# Patient Record
Sex: Female | Born: 1965
Health system: Southern US, Community
[De-identification: ages and names within clinical notes are randomized; demographics above are authoritative.]

## PROBLEM LIST (undated history)

## (undated) DIAGNOSIS — T7840XA Allergy, unspecified, initial encounter: Secondary | ICD-10-CM

## (undated) DIAGNOSIS — I10 Essential (primary) hypertension: Secondary | ICD-10-CM

## (undated) DIAGNOSIS — M8580 Other specified disorders of bone density and structure, unspecified site: Secondary | ICD-10-CM

## (undated) DIAGNOSIS — E079 Disorder of thyroid, unspecified: Secondary | ICD-10-CM

## (undated) HISTORY — DX: Essential (primary) hypertension: I10

## (undated) HISTORY — DX: Disorder of thyroid, unspecified: E07.9

## (undated) HISTORY — DX: Allergy, unspecified, initial encounter: T78.40XA

## (undated) HISTORY — DX: Other specified disorders of bone density and structure, unspecified site: M85.80

---

## 1993-03-10 HISTORY — PX: TOTAL ABDOMINAL HYSTERECTOMY: SHX209

## 2003-03-11 HISTORY — PX: ELBOW SURGERY: SHX618

## 2007-03-11 HISTORY — PX: LAMINECTOMY: SHX219

## 2015-05-22 LAB — HM HEPATITIS C SCREENING LAB: HM Hepatitis Screen: NEGATIVE

## 2015-05-22 LAB — HM HIV SCREENING LAB: HM HIV Screening: NEGATIVE

## 2015-09-22 DIAGNOSIS — M4807 Spinal stenosis, lumbosacral region: Secondary | ICD-10-CM | POA: Insufficient documentation

## 2015-09-22 DIAGNOSIS — M4325 Fusion of spine, thoracolumbar region: Secondary | ICD-10-CM | POA: Insufficient documentation

## 2018-09-17 LAB — HM MAMMOGRAPHY

## 2019-05-27 ENCOUNTER — Ambulatory Visit (INDEPENDENT_AMBULATORY_CARE_PROVIDER_SITE_OTHER): Payer: Self-pay | Admitting: Family Medicine

## 2019-05-27 ENCOUNTER — Encounter: Payer: Self-pay | Admitting: Family Medicine

## 2019-05-27 ENCOUNTER — Other Ambulatory Visit: Payer: Self-pay

## 2019-05-27 ENCOUNTER — Other Ambulatory Visit: Payer: Self-pay | Admitting: Family Medicine

## 2019-05-27 VITALS — BP 127/76 | HR 89 | Temp 98.2°F | Resp 16 | Ht 63.0 in | Wt 200.6 lb

## 2019-05-27 DIAGNOSIS — Z7689 Persons encountering health services in other specified circumstances: Secondary | ICD-10-CM

## 2019-05-27 DIAGNOSIS — G43109 Migraine with aura, not intractable, without status migrainosus: Secondary | ICD-10-CM

## 2019-05-27 DIAGNOSIS — M4325 Fusion of spine, thoracolumbar region: Secondary | ICD-10-CM

## 2019-05-27 DIAGNOSIS — M4807 Spinal stenosis, lumbosacral region: Secondary | ICD-10-CM

## 2019-05-27 DIAGNOSIS — E039 Hypothyroidism, unspecified: Secondary | ICD-10-CM

## 2019-05-27 DIAGNOSIS — Z0000 Encounter for general adult medical examination without abnormal findings: Secondary | ICD-10-CM

## 2019-05-27 DIAGNOSIS — I10 Essential (primary) hypertension: Secondary | ICD-10-CM | POA: Insufficient documentation

## 2019-05-27 DIAGNOSIS — R7309 Other abnormal glucose: Secondary | ICD-10-CM

## 2019-05-27 DIAGNOSIS — J302 Other seasonal allergic rhinitis: Secondary | ICD-10-CM

## 2019-05-27 MED ORDER — MONTELUKAST SODIUM 10 MG PO TABS: 10.0000 mg | ORAL_TABLET | Freq: Every day | ORAL | 1 refills | Status: DC

## 2019-05-27 MED ORDER — BENAZEPRIL HCL 10 MG PO TABS: 10.0000 mg | ORAL_TABLET | Freq: Every day | ORAL | 1 refills | Status: DC

## 2019-05-27 MED ORDER — DULOXETINE HCL 60 MG PO CPEP: 60.0000 mg | ORAL_CAPSULE | Freq: Every day | ORAL | 1 refills | Status: DC

## 2019-05-27 MED ORDER — LEVOTHYROXINE SODIUM 112 MCG PO TABS: 112.0000 ug | ORAL_TABLET | Freq: Every day | ORAL | 1 refills | Status: DC

## 2019-05-27 MED ORDER — ZOLMITRIPTAN 5 MG PO TABS: 5.0000 mg | ORAL_TABLET | ORAL | 2 refills | Status: DC | PRN

## 2019-05-27 NOTE — Assessment & Plan Note (Signed)
Stable without migraine Chronic problem, recurrent flares unsure triggers No recent migraines in past 5+ years Has abortive therapy w/ Zomig PRN, request re order, for this triptan We can consider alternative option such as Nurtec ODT in future if recurrent migraines or need to change therapy

## 2019-05-27 NOTE — Assessment & Plan Note (Signed)
Chronic back pain Secondary to s/p thoracolumbar spine fusion T12-L1 following traumatic injury thrown off horse Currently still has hardware Chronic pain improved w/ Duloxetine Previously w/ [facility] had ESI in back and neck as needed

## 2019-05-27 NOTE — Assessment & Plan Note (Signed)
Controlled on Singulair and oral anti histamine 2nd gen Continue current meds, refilled singulair

## 2019-05-27 NOTE — Progress Notes (Signed)
Subjective:    Patient ID: Tanya Barry, female    DOB: [birth year], 54 y.o.   MRN: [id number]  Tanya Barry is a 54 y.o. female presenting on 05/27/2019 for Establish Care, Hypertension, and Back Pain  Here to establish care new PCP. Previously working as Technical brewer for [employer]. Now she is locating to new provider here locally since she is working at [employer] for [employer] in [place].  HPI   Migraine Headaches - Chronic history of migraine headaches for most of life. She has done well lately. Unsure common triggers for her. She has had less migraines since moving to [place] back in 2015 approximately. She has rx Zolmitriptan (ZOMIG) but not taking it regularly, has it for PRN use that was effective and now doing well. Request refill for now to have as back up if develop migraine.  Allergic Rhinosinusitis / Seasonal Allergies Chronic issues. She was tested by Allergist and ultimately not confirmed allergic to 50 most common allergens but her symptoms respond to OTC 2nd gen antihistamine cetirizine or loratadine and singulair nightly. - Not taking Flonase currently  Hypothyroidism Chronic issue, has caused weight gain. Says this was onset after 2009. Has been steady on dose levothyroxine 100 to 150 approx, has not had many changes, last lab normal 08/2018 TSH  Chronic Back and Neck Pain / Osteoarthritis / DJD with thoracolumbar spinal stenosis History of traumatic injury 2009 thrown off horse and broke her back, had a laminectomy surgery w hardware T12-L1, ultimately developed issues with hypertension and had chest pain and cardiac symptoms after this injury and HTN, she had a cardiac catheterization and ultimately no blockages, they determined anginal symptoms were triggered by "bruising" on the heart.  Taking Cymbalta 60mg  daily, for about past 10 years for chronic back pain. Helps with her aching pain, takes in PM. Also helps her feet, can get some numbness in  toes if stand too long. - Cervical Lumbar ESI injections from Dr Oleta Mouse, likely last visit in 2018 has done well.  CHRONIC HTN: Reports history of controlled HTN on current med. If misses med can have higher BP 140s Current Meds - Benazepril 10mg  daily   Reports good compliance, took meds today. Tolerating well, w/o complaints. Lifestyle: - Diet: limiting eating out, reduced portions, no fast foods - Exercise: walking with dog regularly, limited gym exercise due to hardware in back. Denies CP, dyspnea, HA, edema, dizziness / lightheadedness   Health Maintenance:  [facility] records - Hep C and HIV screening negative, 05/22/15  History of s/p Total Hysterectomy 1995, no pap smear since 2000  Breast Cancer Screenign: Last mammogram 09/17/18 ([facility]) reviewed record, copied below.  Colon CA Screening: Last Colonoscopy 08/21/15 (Dr Rollene [facility]) negative no polyps reportedly good for 10 years, will request record from:  [address], [place]  [place], [place] [address]  [phone]  [phone] (Fax)   [place]  [place], [place], [place] [address]  [phone]   Depression screen [facility] 2/9 05/27/2019  Decreased Interest 0  Down, Depressed, Hopeless 0  PHQ - 2 Score 0    Past Medical History:  Diagnosis Date  . Allergy   . Hypertension   . Osteopenia   . Thyroid disease    Past Surgical History:  Procedure Laterality Date  . ELBOW SURGERY Right 2005   Cubital Tunnel Nerve Release  . LAMINECTOMY  2009   Laminectomy  T12 fusion, with hardware rods/screws from T12-L1  . TOTAL ABDOMINAL HYSTERECTOMY  1995   Total hysterectomy   Social History   Socioeconomic History  . Marital status: Single    Spouse name: Not on file  . Number of children: Not on file  . Years of education: Not on file  . Highest education level: Not on file  Occupational History  . Not on file  Tobacco Use  . Smoking status: Never  Smoker  . Smokeless tobacco: Never Used  Substance and Sexual Activity  . Alcohol use: Yes  . Drug use: Never  . Sexual activity: Not on file  Other Topics Concern  . Not on file  Social History Narrative  . Not on file   Social Determinants of Health   Financial Resource Strain:   . Difficulty of Paying Living Expenses:   Food Insecurity:   . Worried About Charity fundraiser in the Last Year:   . Arboriculturist in the Last Year:   Transportation Needs:   . Film/video editor (Medical):   Marland Kitchen Lack of Transportation (Non-Medical):   Physical Activity:   . Days of Exercise per Week:   . Minutes of Exercise per Session:   Stress:   . Feeling of Stress :   Social Connections:   . Frequency of Communication with Friends and Family:   . Frequency of Social Gatherings with Friends and Family:   . Attends Religious Services:   . Active Member of Clubs or Organizations:   . Attends Archivist Meetings:   Marland Kitchen Marital Status:   Intimate Partner Violence:   . Fear of Current or Ex-Partner:   . Emotionally Abused:   Marland Kitchen Physically Abused:   . Sexually Abused:    Family History  Problem Relation Age of Onset  . Heart disease Mother   . Stroke Mother   . Thyroid disease Mother   . Alzheimer's disease Mother        living in [place]  . Heart disease Father   . Cancer Father        prostate   . Aneurysm Father        brain   Current Outpatient Medications on File Prior to Visit  Medication Sig  . Ascorbic Acid (VITA-C PO) Take 500 mg by mouth.  . fluticasone (FLONASE) 50 MCG/ACT nasal spray Place into the nose.  . loratadine (CLARITIN) 10 MG tablet TAKE 1 TABLET DAILY  . Multiple Vitamin (MULTIVITAMIN) tablet Take 1 tablet by mouth daily.  . Probiotic Product (PROBIOTIC-10 PO) Take by mouth.   No current facility-administered medications on file prior to visit.    Review of Systems Per HPI unless specifically indicated above      Objective:    BP  127/76   Pulse 89   Temp 98.2 F (36.8 C) (Temporal)   Resp 16   Ht 5\' 3"  (1.6 m)   Wt 200 lb 9.6 oz (91 kg)   SpO2 99%   BMI 35.53 kg/m   Wt Readings from Last 3 Encounters:  05/27/19 200 lb 9.6 oz (91 kg)    Physical Exam Vitals and nursing note reviewed.  Constitutional:      General: She is not in acute distress.    Appearance: She is well-developed. She is not diaphoretic.     Comments: Well-appearing, comfortable, cooperative  HENT:     Head: Normocephalic and atraumatic.  Eyes:     General:  Right eye: No discharge.        Left eye: No discharge.     Conjunctiva/sclera: Conjunctivae normal.  Cardiovascular:     Rate and Rhythm: Normal rate.  Pulmonary:     Effort: Pulmonary effort is normal.  Skin:    General: Skin is warm and dry.     Findings: No erythema or rash.  Neurological:     Mental Status: She is alert and oriented to person, place, and time.  Psychiatric:        Behavior: Behavior normal.     Comments: Well groomed, good eye contact, normal speech and thoughts      Mammo screening breast tomosynthesis bilateral7/12/2018 [facility] Result Impression  No mammographic evidence of malignancy. Recommend routine mammography  screening in one year.    The patient has been or will be contacted.  BI-RADS: 2 - Benign  Result Narrative  EXAM: MAMMO SCREEN BREAST WITH TOMOSYNTHESIS BILATERAL 09/16/2018 3:30 PM  INDICATION: Screening  COMPARISON: Compared to: 08/05/2017 Mammo screening breast tomosynthesis bilateral and  05/31/2015 Mammo screening digital bilateral   TECHNIQUE: Tomosynthesis images were obtained as part of this exam.  FINDINGS: The breasts have scattered areas of fibroglandular density.  There are no suspicious masses, calcifications, or other findings in  either breast.     Results for orders placed or performed in visit on 05/27/19  HM MAMMOGRAPHY  Result Value Ref Range   HM Mammogram 0-4  Bi-Rad 0-4 Bi-Rad, Self Reported Normal  HM HIV SCREENING LAB  Result Value Ref Range   HM HIV Screening Negative - Validated   HM HEPATITIS C SCREENING LAB  Result Value Ref Range   HM Hepatitis Screen Negative-Validated       Assessment & Plan:   Problem List Items Addressed This Visit    Stenosis of lumbosacral spine   Relevant Medications   DULoxetine (CYMBALTA) 60 MG capsule   Seasonal allergies    Controlled on Singulair and oral anti histamine 2nd gen Continue current meds, refilled singulair      Relevant Medications   montelukast (SINGULAIR) 10 MG tablet   Migraine with aura and without status migrainosus, not intractable    Stable without migraine Chronic problem, recurrent flares unsure triggers No recent migraines in past 5+ years Has abortive therapy w/ Zomig PRN, request re order, for this triptan We can consider alternative option such as Nurtec ODT in future if recurrent migraines or need to change therapy      Relevant Medications   zolmitriptan (ZOMIG) 5 MG tablet   DULoxetine (CYMBALTA) 60 MG capsule   benazepril (LOTENSIN) 10 MG tablet   Fusion of spine of thoracolumbar region    Chronic back pain Secondary to s/p thoracolumbar spine fusion T12-L1 following traumatic injury thrown off horse Currently still has hardware Chronic pain improved w/ Duloxetine Previously w/ [facility] had ESI in back and neck as needed      Relevant Medications   DULoxetine (CYMBALTA) 60 MG capsule   Essential hypertension    Well-controlled HTN, possibly related to history of back pain in past when onset on med  No known complications     Plan:  1. Continue current BP regimen - refilled Benazepril 10mg  daily 2. Encourage improved lifestyle - low sodium diet, regular exercise 3. May monitor BP outside office  Labs / follow up in 4 weeks      Relevant Medications   benazepril (LOTENSIN) 10 MG tablet   Acquired hypothyroidism -  Primary    Controlled  hypothyroidism On levothyroxine currently 174mcg daily Last lab TSH normal 08/2018 Due for repeat thyroid panel in 4 weeks w/ annual      Relevant Medications   levothyroxine (SYNTHROID) 112 MCG tablet    Other Visit Diagnoses    Encounter to establish care with new doctor          Review outside [facility] specialists.  Meds ordered this encounter  Medications  . zolmitriptan (ZOMIG) 5 MG tablet    Sig: Take 1 tablet (5 mg total) by mouth as needed for migraine.    Dispense:  10 tablet    Refill:  2  . montelukast (SINGULAIR) 10 MG tablet    Sig: Take 1 tablet (10 mg total) by mouth at bedtime.    Dispense:  90 tablet    Refill:  1  . levothyroxine (SYNTHROID) 112 MCG tablet    Sig: Take 1 tablet (112 mcg total) by mouth daily before breakfast.    Dispense:  90 tablet    Refill:  1  . DULoxetine (CYMBALTA) 60 MG capsule    Sig: Take 1 capsule (60 mg total) by mouth daily.    Dispense:  90 capsule    Refill:  1  . benazepril (LOTENSIN) 10 MG tablet    Sig: Take 1 tablet (10 mg total) by mouth daily.    Dispense:  90 tablet    Refill:  1     Follow up plan: Return in about 4 weeks (around 06/24/2019) for Annual Physical.   Future labs ordered for 06/17/19. CMET CBC Lipid A1c TSH Free T4  Nobie Putnam, DO [facility] 05/27/2019, 3:16 PM

## 2019-05-27 NOTE — Assessment & Plan Note (Signed)
Well-controlled HTN, possibly related to history of back pain in past when onset on med  No known complications     Plan:  1. Continue current BP regimen - refilled Benazepril 10mg  daily 2. Encourage improved lifestyle - low sodium diet, regular exercise 3. May monitor BP outside office  Labs / follow up in 4 weeks

## 2019-05-27 NOTE — Patient Instructions (Addendum)
Thank you for coming to the office today.  Refilled Zomig (Zolmitriptan)- if this is not preferred anymore or cost is higher we can consider alternative option - such as Nurtec dissolving tablet, it is an excellent medicine that is newer and has limited side effects and can be effective for up to 48 hours after 1 dose.  Meds were re ordered 90 day with refill  Next apt we will order Mammogram  Call the [facility] below anytime to schedule your own appointment now that order has been placed.  [facility] [place], [place] [address] Phone: [phone]   DUE for FASTING BLOOD WORK (no food or drink after midnight before the lab appointment, only water or coffee without cream/sugar on the morning of)  SCHEDULE "Lab Only" visit in the morning at the clinic for lab draw in 4 WEEKS   - Make sure Lab Only appointment is at about 1 week before your next appointment, so that results will be available  For Lab Results, once available within 2-3 days of blood draw, you can can log in to MyChart online to view your results and a brief explanation. Also, we can discuss results at next follow-up visit.    Please schedule a Follow-up Appointment to: Return in about 4 weeks (around 06/24/2019) for Annual Physical.  If you have any other questions or concerns, please feel free to call the office or send a message through [place]. You may also schedule an earlier appointment if necessary.  Additionally, you may be receiving a survey about your experience at our office within a few days to 1 week by e-mail or mail. We value your feedback.  Nobie Putnam, DO [facility]

## 2019-05-27 NOTE — Assessment & Plan Note (Signed)
Controlled hypothyroidism On levothyroxine currently 124mcg daily Last lab TSH normal 08/2018 Due for repeat thyroid panel in 4 weeks w/ annual

## 2019-05-31 ENCOUNTER — Telehealth: Payer: Self-pay | Admitting: Family Medicine

## 2019-05-31 DIAGNOSIS — G43109 Migraine with aura, not intractable, without status migrainosus: Secondary | ICD-10-CM

## 2019-05-31 MED ORDER — SUMATRIPTAN SUCCINATE 50 MG PO TABS: 50.0000 mg | ORAL_TABLET | Freq: Once | ORAL | 2 refills | Status: DC | PRN

## 2019-05-31 NOTE — Telephone Encounter (Signed)
Fax that Zomig requires PA. Preferred sumatriptan or rizatriptan trial in past 180 days.  Called patient, she has never taken those meds.  DC Zomig  Ordered Sumatriptan 50mg      Notes to Pharmacy: Discontinued Zomig Zolmitriptan - due to insurance preference. Switched to Sumatriptan    Nobie Putnam, [facility] 05/31/2019, 5:17 PM

## 2019-06-17 ENCOUNTER — Other Ambulatory Visit: Payer: Self-pay

## 2019-06-17 DIAGNOSIS — I10 Essential (primary) hypertension: Secondary | ICD-10-CM

## 2019-06-17 DIAGNOSIS — E039 Hypothyroidism, unspecified: Secondary | ICD-10-CM

## 2019-06-17 DIAGNOSIS — Z0000 Encounter for general adult medical examination without abnormal findings: Secondary | ICD-10-CM

## 2019-06-17 DIAGNOSIS — R7309 Other abnormal glucose: Secondary | ICD-10-CM

## 2019-06-18 LAB — LIPID PANEL
Cholesterol: 177 mg/dL (ref <200)
HDL: 51 mg/dL (ref >=50)
LDL Cholesterol (Calc): 107 mg/dL (calc) — ABNORMAL HIGH
Non-HDL Cholesterol (Calc): 126 mg/dL (calc) (ref <130)
Total CHOL/HDL Ratio: 3.5 (calc) (ref <5.0)
Triglycerides: 95 mg/dL (ref <150)

## 2019-06-18 LAB — COMPLETE METABOLIC PANEL WITH GFR
AG Ratio: 1.4 (calc) (ref 1.0–2.5)
ALT: 18 U/L (ref 6–29)
AST: 21 U/L (ref 10–35)
Albumin: 3.8 g/dL (ref 3.6–5.1)
Alkaline phosphatase (APISO): 79 U/L (ref 37–153)
BUN: 20 mg/dL (ref 7–25)
CO2: 28 mmol/L (ref 20–32)
Calcium: 9.1 mg/dL (ref 8.6–10.4)
Chloride: 102 mmol/L (ref 98–110)
Creat: 0.61 mg/dL (ref 0.50–1.05)
GFR, Est African American: 117 mL/min/{1.73_m2} (ref >=60)
GFR, Est Non African American: 101 mL/min/{1.73_m2} (ref >=60)
Globulin: 2.7 g/dL (calc) (ref 1.9–3.7)
Glucose, Bld: 82 mg/dL (ref 65–99)
Potassium: 4.5 mmol/L (ref 3.5–5.3)
Sodium: 136 mmol/L (ref 135–146)
Total Bilirubin: 0.3 mg/dL (ref 0.2–1.2)
Total Protein: 6.5 g/dL (ref 6.1–8.1)

## 2019-06-18 LAB — HEMOGLOBIN A1C
Hgb A1c MFr Bld: 5.1 % of total Hgb (ref <5.7)
Mean Plasma Glucose: 100 (calc)
eAG (mmol/L): 5.5 (calc)

## 2019-06-18 LAB — CBC WITH DIFFERENTIAL/PLATELET
Absolute Monocytes: 584 cells/uL (ref 200–950)
Basophils Absolute: 59 cells/uL (ref 0–200)
Basophils Relative: 1 %
Eosinophils Absolute: 212 cells/uL (ref 15–500)
Eosinophils Relative: 3.6 %
HCT: 38.1 % (ref 35.0–45.0)
Hemoglobin: 12.1 g/dL (ref 11.7–15.5)
Lymphs Abs: 2395 cells/uL (ref 850–3900)
MCH: 28.8 pg (ref 27.0–33.0)
MCHC: 31.8 g/dL — ABNORMAL LOW (ref 32.0–36.0)
MCV: 90.7 fL (ref 80.0–100.0)
MPV: 10.3 fL (ref 7.5–12.5)
Monocytes Relative: 9.9 %
Neutro Abs: 2649 cells/uL (ref 1500–7800)
Neutrophils Relative %: 44.9 %
Platelets: 349 10*3/uL (ref 140–400)
RBC: 4.2 10*6/uL (ref 3.80–5.10)
RDW: 12 % (ref 11.0–15.0)
Total Lymphocyte: 40.6 %
WBC: 5.9 10*3/uL (ref 3.8–10.8)

## 2019-06-18 LAB — TSH: TSH: 0.03 mIU/L — ABNORMAL LOW (ref 0.40–4.50)

## 2019-06-18 LAB — T4, FREE: Free T4: 1.5 ng/dL (ref 0.8–1.8)

## 2019-06-24 ENCOUNTER — Other Ambulatory Visit: Payer: Self-pay

## 2019-06-24 ENCOUNTER — Other Ambulatory Visit: Payer: Self-pay | Admitting: Family Medicine

## 2019-06-24 ENCOUNTER — Encounter: Payer: Self-pay | Admitting: Family Medicine

## 2019-06-24 ENCOUNTER — Ambulatory Visit (INDEPENDENT_AMBULATORY_CARE_PROVIDER_SITE_OTHER): Payer: 59 | Admitting: Family Medicine

## 2019-06-24 VITALS — BP 118/75 | HR 76 | Temp 97.5°F | Resp 16 | Ht 63.0 in | Wt 202.6 lb

## 2019-06-24 DIAGNOSIS — Z0000 Encounter for general adult medical examination without abnormal findings: Secondary | ICD-10-CM | POA: Diagnosis not present

## 2019-06-24 DIAGNOSIS — E039 Hypothyroidism, unspecified: Secondary | ICD-10-CM

## 2019-06-24 DIAGNOSIS — I10 Essential (primary) hypertension: Secondary | ICD-10-CM

## 2019-06-24 NOTE — Assessment & Plan Note (Signed)
Controlled hypothyroidism, but last lab was mild low TSH otherwise normal Free T4 Asymptomatic On levothyroxine currently 159mcg daily  Continue current dose Repeat in 6 months

## 2019-06-24 NOTE — Patient Instructions (Addendum)
Thank you for coming to the office today.  Ask again if they can send request for record Last Colonoscopy 08/21/15 (Dr Rollene [facility]unda [facility])  1. Chemistry - Normal results, including electrolytes, kidney and liver function. - Normal fasting blood sugar   2. Hemoglobin A1c (Diabetes screening) - 5.1, normal not in range of Pre-Diabetes (>5.7 to 6.4)   3. TSH Thyroid Function Tests - Low TSH meaning may have too high of dose Levothyroxine at 146mcg daily, however Free T4 is in normal range. - No change to dose today.  4. CBC Blood Counts - Normal, no anemia, no other significant abnormality  5. Cholesterol - Mostly normal cholesterol results. Normal HDL (good cholesterol), slightly elevated 107 LDL (bad cholesterol), and normal Triglycerides.  - No need for cholesterol medicine at this time.  ---------------------------------------------  DUE for FASTING BLOOD WORK (no food or drink after midnight before the lab appointment, only water or coffee without cream/sugar on the morning of)  SCHEDULE "Lab Only" visit in the morning at the clinic for lab draw in 6 MONTHS   - Make sure Lab Only appointment is at about 1 week before your next appointment, so that results will be available  For Lab Results, once available within 2-3 days of blood draw, you can can log in to MyChart online to view your results and a brief explanation. Also, we can discuss results at next follow-up visit.   Please schedule a Follow-up Appointment to: Return in about 6 months (around 12/24/2019) for 6 month Thyroid labs.  If you have any other questions or concerns, please feel free to call the office or send a message through [place]. You may also schedule an earlier appointment if necessary.  Additionally, you may be receiving a survey about your experience at our office within a few days to 1 week by e-mail or mail. We value your feedback.  Nobie Putnam, DO [facility]

## 2019-06-24 NOTE — Assessment & Plan Note (Signed)
Well-controlled HTN, possibly related to history of back pain in past when onset on med  No known complications     Plan:  1. Continue current BP regimen - refilled Benazepril 10mg  daily 2. Encourage improved lifestyle - low sodium diet, regular exercise 3. May monitor BP outside office

## 2019-06-24 NOTE — Progress Notes (Signed)
Subjective:    Patient ID: Tanya Barry, female    DOB: 07/28/1965, 54 y.o.   MRN: [id number]  Tanya Barry is a 54 y.o. female presenting on 06/24/2019 for Annual Exam   HPI   Here for Annual Physical and Lab Review.  Migraine Headaches Chronic history of migraine headaches for most of life. She has done well lately. Last visit reordered medicine.  Allergic Rhinosinusitis / Seasonal Allergies  Hypothyroidism Chronic issue, has caused weight gain. Says this was onset after 2009. Has been steady on dose levothyroxine 100 to 150 approx, has not had many changes Last labs were 06/2019 showed mild low TSH 0.03, and normal free t4 1.5 She is on Levothyroxine 133mcg daily, has no complaints today  Chronic Back and Neck Pain / Osteoarthritis / DJD with thoracolumbar spinal stenosis History of traumatic injury 2009 thrown off horse and broke her back, had a laminectomy surgery w hardware T12-L1, ultimately developed issues with hypertension and had chest pain and cardiac symptoms after this injury and HTN, she had a cardiac catheterization and ultimately no blockages, they determined anginal symptoms were triggered by "bruising" on the heart.  Taking Cymbalta 60mg  daily, for about past 10 years for chronic back pain. Helps with her aching pain, takes in PM. Also helps her feet, can get some numbness in toes if stand too long. - Cervical Lumbar ESI injections from Dr Oleta Mouse, likely last visit in 2018 has done well.  CHRONIC HTN: Reports history of controlled HTN on current med. If misses med can have higher BP 140s Doing well normal BP today Current Meds - Benazepril 10mg  daily   Reports good compliance, took meds today. Tolerating well, w/o complaints. Lifestyle: - Diet: limiting eating out, reduced portions, no fast foods - Exercise: walking with dog regularly, limited gym exercise due to hardware in back. Denies CP, dyspnea, HA, edema, dizziness / lightheadedness   Health  Maintenance:  [facility] records - Hep C and HIV screening negative, 05/22/15  History of s/p Total Hysterectomy 1995, no pap smear since 2000  Breast Cancer Screenign: Last mammogram 09/17/18 ([facility]) reviewed record, copied below.  Colon CA Screening: Last Colonoscopy 08/21/15 (Dr [facility]) negative no polyps reportedly good for 10 years, will request record - still did not receive since last time.   Depression screen [facility] 2/9 06/24/2019 05/27/2019  Decreased Interest 0 0  Down, Depressed, Hopeless 0 0  PHQ - 2 Score 0 0    Past Medical History:  Diagnosis Date  . Allergy   . Hypertension   . Osteopenia   . Thyroid disease    Past Surgical History:  Procedure Laterality Date  . ELBOW SURGERY Right 2005   Cubital Tunnel Nerve Release  . LAMINECTOMY  2009   Laminectomy T12 fusion, with hardware rods/screws from T12-L1  . TOTAL ABDOMINAL HYSTERECTOMY  1995   Total hysterectomy   Social History   Socioeconomic History  . Marital status: Single    Spouse name: Not on file  . Number of children: Not on file  . Years of education: Not on file  . Highest education level: Not on file  Occupational History  . Not on file  Tobacco Use  . Smoking status: Never Smoker  . Smokeless tobacco: Never Used  Substance and Sexual Activity  . Alcohol use: Yes  . Drug use: Never  . Sexual activity: Not on file  Other Topics Concern  . Not on file  Social History Narrative  .  Not on file   Social Determinants of Health   Financial Resource Strain:   . Difficulty of Paying Living Expenses:   Food Insecurity:   . Worried About Charity fundraiser in the Last Year:   . Arboriculturist in the Last Year:   Transportation Needs:   . Film/video editor (Medical):   Marland Kitchen Lack of Transportation (Non-Medical):   Physical Activity:   . Days of Exercise per Week:   . Minutes of Exercise per Session:   Stress:   . Feeling of Stress :   Social Connections:   . Frequency of  Communication with Friends and Family:   . Frequency of Social Gatherings with Friends and Family:   . Attends Religious Services:   . Active Member of Clubs or Organizations:   . Attends Archivist Meetings:   Marland Kitchen Marital Status:   Intimate Partner Violence:   . Fear of Current or Ex-Partner:   . Emotionally Abused:   Marland Kitchen Physically Abused:   . Sexually Abused:    Family History  Problem Relation Age of Onset  . Heart disease Mother   . Stroke Mother   . Thyroid disease Mother   . Alzheimer's disease Mother        living in [place]  . Heart disease Father   . Cancer Father        prostate   . Aneurysm Father        brain   Current Outpatient Medications on File Prior to Visit  Medication Sig  . amoxicillin (AMOXIL) 500 MG tablet Take 500 mg by mouth 4 (four) times daily.  . Ascorbic Acid (VITA-C PO) Take 500 mg by mouth.  . benazepril (LOTENSIN) 10 MG tablet Take 1 tablet (10 mg total) by mouth daily.  . DULoxetine (CYMBALTA) 60 MG capsule Take 1 capsule (60 mg total) by mouth daily.  . fluticasone (FLONASE) 50 MCG/ACT nasal spray Place into the nose.  . levothyroxine (SYNTHROID) 112 MCG tablet Take 1 tablet (112 mcg total) by mouth daily before breakfast.  . loratadine (CLARITIN) 10 MG tablet TAKE 1 TABLET DAILY  . montelukast (SINGULAIR) 10 MG tablet Take 1 tablet (10 mg total) by mouth at bedtime.  . Multiple Vitamin (MULTIVITAMIN) tablet Take 1 tablet by mouth daily.  . Probiotic Product (PROBIOTIC-10 PO) Take by mouth.  . SUMAtriptan (IMITREX) 50 MG tablet Take 1 tablet (50 mg total) by mouth once as needed for up to 1 dose for migraine. May repeat one dose in 2 hours if headache persists, for max dose 24 hours   No current facility-administered medications on file prior to visit.    Review of Systems  Constitutional: Negative for activity change, appetite change, chills, diaphoresis, fatigue and fever.  HENT: Negative for congestion and hearing loss.     Eyes: Negative for visual disturbance.  Respiratory: Negative for cough, chest tightness, shortness of breath and wheezing.   Cardiovascular: Negative for chest pain, palpitations and leg swelling.  Gastrointestinal: Negative for abdominal pain, constipation, diarrhea, nausea and vomiting.  Genitourinary: Negative for dysuria, frequency and hematuria.  Musculoskeletal: Negative for arthralgias and neck pain.  Skin: Negative for rash.  Neurological: Negative for dizziness, weakness, light-headedness, numbness and headaches.  Hematological: Negative for adenopathy.  Psychiatric/Behavioral: Negative for behavioral problems, dysphoric mood and sleep disturbance.   Per HPI unless specifically indicated above      Objective:    BP 118/75   Pulse 76   Temp (!)  97.5 F (36.4 C) (Temporal)   Resp 16   Ht 5\' 3"  (1.6 m)   Wt 202 lb 9.6 oz (91.9 kg)   BMI 35.89 kg/m   Wt Readings from Last 3 Encounters:  06/24/19 202 lb 9.6 oz (91.9 kg)  05/27/19 200 lb 9.6 oz (91 kg)    Physical Exam Vitals and nursing note reviewed.  Constitutional:      General: She is not in acute distress.    Appearance: She is well-developed. She is not diaphoretic.     Comments: Well-appearing, comfortable, cooperative  HENT:     Head: Normocephalic and atraumatic.  Eyes:     General:        Right eye: No discharge.        Left eye: No discharge.     Conjunctiva/sclera: Conjunctivae normal.     Pupils: Pupils are equal, round, and reactive to light.  Neck:     Thyroid: No thyromegaly.     Comments: No thyromegaly Cardiovascular:     Rate and Rhythm: Normal rate and regular rhythm.     Heart sounds: Normal heart sounds. No murmur.  Pulmonary:     Effort: Pulmonary effort is normal. No respiratory distress.     Breath sounds: Normal breath sounds. No wheezing or rales.  Abdominal:     General: Bowel sounds are normal. There is no distension.     Palpations: Abdomen is soft. There is no mass.      Tenderness: There is no abdominal tenderness.  Musculoskeletal:        General: No tenderness. Normal range of motion.     Cervical back: Normal range of motion and neck supple.     Comments: Upper / Lower Extremities: - Normal muscle tone, strength bilateral upper extremities 5/5, lower extremities 5/5  Lymphadenopathy:     Cervical: No cervical adenopathy.  Skin:    General: Skin is warm and dry.     Findings: No erythema or rash.  Neurological:     Mental Status: She is alert and oriented to person, place, and time.     Comments: Distal sensation intact to light touch all extremities  Psychiatric:        Behavior: Behavior normal.     Comments: Well groomed, good eye contact, normal speech and thoughts    Results for orders placed or performed in visit on 06/17/19  T4, free  Result Value Ref Range   Free T4 1.5 0.8 - 1.8 ng/dL  TSH  Result Value Ref Range   TSH 0.03 (L) 0.40 - 4.50 mIU/L  Lipid panel  Result Value Ref Range   Cholesterol 177 <200 mg/dL   HDL 51 > OR = 50 mg/dL   Triglycerides 95 <150 mg/dL   LDL Cholesterol (Calc) 107 (H) mg/dL (calc)   Total CHOL/HDL Ratio 3.5 <5.0 (calc)   Non-HDL Cholesterol (Calc) 126 <130 mg/dL (calc)  COMPLETE METABOLIC PANEL WITH GFR  Result Value Ref Range   Glucose, Bld 82 65 - 99 mg/dL   BUN 20 7 - 25 mg/dL   Creat 0.61 0.50 - 1.05 mg/dL   GFR, Est Non African American 101 > OR = 60 mL/min/1.11m2   GFR, Est African American 117 > OR = 60 mL/min/1.60m2   BUN/Creatinine Ratio NOT APPLICABLE 6 - 22 (calc)   Sodium 136 135 - 146 mmol/L   Potassium 4.5 3.5 - 5.3 mmol/L   Chloride 102 98 - 110 mmol/L   CO2 28 20 -  32 mmol/L   Calcium 9.1 8.6 - 10.4 mg/dL   Total Protein 6.5 6.1 - 8.1 g/dL   Albumin 3.8 3.6 - 5.1 g/dL   Globulin 2.7 1.9 - 3.7 g/dL (calc)   AG Ratio 1.4 1.0 - 2.5 (calc)   Total Bilirubin 0.3 0.2 - 1.2 mg/dL   Alkaline phosphatase (APISO) 79 37 - 153 U/L   AST 21 10 - 35 U/L   ALT 18 6 - 29 U/L  CBC with  Differential/Platelet  Result Value Ref Range   WBC 5.9 3.8 - 10.8 Thousand/uL   RBC 4.20 3.80 - 5.10 Million/uL   Hemoglobin 12.1 11.7 - 15.5 g/dL   HCT 38.1 35.0 - 45.0 %   MCV 90.7 80.0 - 100.0 fL   MCH 28.8 27.0 - 33.0 pg   MCHC 31.8 (L) 32.0 - 36.0 g/dL   RDW 12.0 11.0 - 15.0 %   Platelets 349 140 - 400 Thousand/uL   MPV 10.3 7.5 - 12.5 fL   Neutro Abs 2,649 1,500 - 7,800 cells/uL   Lymphs Abs 2,395 850 - 3,900 cells/uL   Absolute Monocytes 584 200 - 950 cells/uL   Eosinophils Absolute 212 15 - 500 cells/uL   Basophils Absolute 59 0 - 200 cells/uL   Neutrophils Relative % 44.9 %   Total Lymphocyte 40.6 %   Monocytes Relative 9.9 %   Eosinophils Relative 3.6 %   Basophils Relative 1.0 %  Hemoglobin A1c  Result Value Ref Range   Hgb A1c MFr Bld 5.1 <5.7 % of total Hgb   Mean Plasma Glucose 100 (calc)   eAG (mmol/L) 5.5 (calc)      Assessment & Plan:   Problem List Items Addressed This Visit    Essential hypertension    Well-controlled HTN, possibly related to history of back pain in past when onset on med  No known complications     Plan:  1. Continue current BP regimen - refilled Benazepril 10mg  daily 2. Encourage improved lifestyle - low sodium diet, regular exercise 3. May monitor BP outside office      Acquired hypothyroidism    Controlled hypothyroidism, but last lab was mild low TSH otherwise normal Free T4 Asymptomatic On levothyroxine currently 164mcg daily  Continue current dose Repeat in 6 months       Other Visit Diagnoses    Annual physical exam    -  Primary      Updated Health Maintenance information - Request record from 2017 colonoscopy [facility], otherwise up to date Reviewed recent lab results with patient Encouraged improvement to lifestyle with diet and exercise - Goal of weight loss   No orders of the defined types were placed in this encounter.     Follow up plan: Return in about 6 months (around 12/24/2019) for 6 month  Thyroid labs.   Future labs TSH Free T4 6 months  Nobie Putnam, DO [facility] 06/24/2019, 2:12 PM

## 2019-08-10 DIAGNOSIS — J3089 Other allergic rhinitis: Secondary | ICD-10-CM

## 2019-08-10 DIAGNOSIS — J302 Other seasonal allergic rhinitis: Secondary | ICD-10-CM

## 2019-08-13 ENCOUNTER — Ambulatory Visit (INDEPENDENT_AMBULATORY_CARE_PROVIDER_SITE_OTHER): Payer: 59

## 2019-08-13 ENCOUNTER — Other Ambulatory Visit: Payer: Self-pay

## 2019-08-13 ENCOUNTER — Ambulatory Visit
Admission: EM | Admit: 2019-08-13 | Discharge: 2019-08-13 | Disposition: A | Discharge status: Home / Self Care | Payer: 59 | Attending: Family Medicine | Admitting: Family Medicine

## 2019-08-13 DIAGNOSIS — S92355A Nondisplaced fracture of fifth metatarsal bone, left foot, initial encounter for closed fracture: Secondary | ICD-10-CM

## 2019-08-13 DIAGNOSIS — S92352A Displaced fracture of fifth metatarsal bone, left foot, initial encounter for closed fracture: Secondary | ICD-10-CM | POA: Diagnosis not present

## 2019-08-13 NOTE — ED Triage Notes (Addendum)
Patient states that today she stepped in a hole wrong and turned over her left foot. Patient with swelling and bruising to outside of foot.

## 2019-08-13 NOTE — Discharge Instructions (Addendum)
Ice.  Elevate.  Keep in splint.  Use crutches.  Follow-up with podiatry or orthopedic this week as discussed.  Follow up with your primary care physician this week as needed. Return to Urgent care for new or worsening concerns.

## 2019-08-13 NOTE — ED Provider Notes (Signed)
[facility] ____________________________________________  Time seen: Approximately 2:26 PM  I have reviewed the triage vital signs and the nursing notes.   HISTORY  Chief Complaint Foot Injury (left)   HPI Tanya Barry is a 54 y.o. female presenting for evaluation of left lateral foot pain after injury that occurred this morning.  Reports she excellently stepped into hole wrong and inverted her left foot.  Reports pain to left foot since with associated bruising and swelling.  Has been able to weight-bear but has to walk on the inside of her foot.  Denies pain radiation, paresthesias or other discomfort.  Reports otherwise doing well.  Denies recent sickness.   Past Medical History:  Diagnosis Date  . Allergy   . Hypertension   . Osteopenia   . Thyroid disease     Patient Active Problem List   Diagnosis Date Noted  . Acquired hypothyroidism 05/27/2019  . Essential hypertension 05/27/2019  . Seasonal allergies 05/27/2019  . Migraine with aura and without status migrainosus, not intractable 05/27/2019  . Fusion of spine of thoracolumbar region 09/22/2015  . Stenosis of lumbosacral spine 09/22/2015    Past Surgical History:  Procedure Laterality Date  . ELBOW SURGERY Right 2005   Cubital Tunnel Nerve Release  . LAMINECTOMY  2009   Laminectomy T12 fusion, with hardware rods/screws from T12-L1  . TOTAL ABDOMINAL HYSTERECTOMY  1995   Total hysterectomy     No current facility-administered medications for this encounter.  Current Outpatient Medications:  .  Ascorbic Acid (VITA-C PO), Take 500 mg by mouth., Disp: , Rfl:  .  benazepril (LOTENSIN) 10 MG tablet, Take 1 tablet (10 mg total) by mouth daily., Disp: 90 tablet, Rfl: 1 .  DULoxetine (CYMBALTA) 60 MG capsule, Take 1 capsule (60 mg total) by mouth daily., Disp: 90 capsule, Rfl: 1 .  fluticasone (FLONASE) 50 MCG/ACT nasal spray, Place into the nose., Disp: , Rfl:  .  levothyroxine (SYNTHROID) 112  MCG tablet, Take 1 tablet (112 mcg total) by mouth daily before breakfast., Disp: 90 tablet, Rfl: 1 .  loratadine (CLARITIN) 10 MG tablet, TAKE 1 TABLET DAILY, Disp: , Rfl:  .  montelukast (SINGULAIR) 10 MG tablet, Take 1 tablet (10 mg total) by mouth at bedtime., Disp: 90 tablet, Rfl: 1 .  Multiple Vitamin (MULTIVITAMIN) tablet, Take 1 tablet by mouth daily., Disp: , Rfl:  .  Probiotic Product (PROBIOTIC-10 PO), Take by mouth., Disp: , Rfl:  .  SUMAtriptan (IMITREX) 50 MG tablet, Take 1 tablet (50 mg total) by mouth once as needed for up to 1 dose for migraine. May repeat one dose in 2 hours if headache persists, for max dose 24 hours, Disp: 10 tablet, Rfl: 2 .  amoxicillin (AMOXIL) 500 MG tablet, Take 500 mg by mouth 4 (four) times daily., Disp: , Rfl:   Allergies Gabapentin and Aspirin  Family History  Problem Relation Age of Onset  . Heart disease Mother   . Stroke Mother   . Thyroid disease Mother   . Alzheimer's disease Mother        living in [place]  . Heart disease Father   . Cancer Father        prostate   . Aneurysm Father        brain    Social History Social History   Tobacco Use  . Smoking status: Never Smoker  . Smokeless tobacco: Never Used  Substance Use Topics  . Alcohol use: Yes  . Drug use: Never  Review of Systems Constitutional: No fever Cardiovascular: Denies chest pain. Respiratory: Denies shortness of breath. Musculoskeletal: Positive left foot pain. Skin: Negative for rash. Neurological: Negative for numbness.    ____________________________________________   PHYSICAL EXAM:  VITAL SIGNS: ED Triage Vitals  Enc Vitals Group     BP 08/13/19 1300 (!) 132/94     Pulse Rate 08/13/19 1300 68     Resp 08/13/19 1300 18     Temp 08/13/19 1300 98.3 F (36.8 C)     Temp Source 08/13/19 1300 Oral     SpO2 08/13/19 1300 100 %     Weight 08/13/19 1258 198 lb (89.8 kg)     Height 08/13/19 1258 5\' 4"  (1.626 m)     Head Circumference --       Peak Flow --      Pain Score 08/13/19 1258 8     Pain Loc --      Pain Edu? --      Excl. in [place]? --     Constitutional: Alert and oriented. Well appearing and in no acute distress. Eyes: Conjunctivae are normal.  ENT      Head: Normocephalic and atraumatic. Cardiovascular: Good peripheral circulation. Respiratory: Normal respiratory effort without tachypnea nor retractions.  Musculoskeletal: Gait not tested due to pain.  Distal pedal pulses intact. Except: Left lateral foot at distal to mid 5th metatarsal tenderness to direct palpation with swelling and ecchymosis, left foot otherwise nontender, normal distal sensation cap refill. Neurologic:  Normal speech and language.  Skin:  Skin is warm, dry and intact. No rash noted. Psychiatric: Mood and affect are normal. Speech and behavior are normal. Patient exhibits appropriate insight and judgment   ___________________________________________   LABS (all labs ordered are listed, but only abnormal results are displayed)  Labs Reviewed - No data to display  RADIOLOGY  DG Foot Complete Left  Result Date: 08/13/2019 CLINICAL DATA:  Foot injury stepping in a hole. Swelling and bruising laterally. EXAM: LEFT FOOT - COMPLETE 3+ VIEW COMPARISON:  None. FINDINGS: Oblique fracture of the distal shaft and distal metaphysis of the fifth metatarsal with surrounding soft tissue swelling. Mild spurring of the first metatarsal head. No malalignment at the Lisfranc joint. No other fractures are identified. Elongated plantar calcaneal spur. IMPRESSION: Oblique fracture of the distal shaft and distal metaphysis of the fifth metatarsal. Plantar calcaneal spur. Electronically Signed   By: Van Clines M.D.   On: 08/13/2019 13:53   ____________________________________________   PROCEDURES Procedures   Posterior OCL splint applied to left foot by nursing staff.  Crutches given.  INITIAL IMPRESSION / ASSESSMENT AND PLAN / ED COURSE  Pertinent  labs & imaging results that were available during my care of the patient were reviewed by me and considered in my medical decision making (see chart for details).  Revision.  No acute distress.  Left foot pain post mechanical injury as above, left foot x-ray oblique fracture of the distal shaft of the distal meta physis of 5th metatarsal.  Posterior OCL splint applied.  Crutches given.  Patient declined need for pain medication.  Ice, elevate and supportive care.  Follow-up with podiatry or orthopedic this coming week.   Discussed follow up and return parameters including no resolution or any worsening concerns. Patient verbalized understanding and agreed to plan.   ____________________________________________   FINAL CLINICAL IMPRESSION(S) / ED DIAGNOSES  Final diagnoses:  Nondisplaced fracture of fifth metatarsal bone, left foot, initial encounter for closed fracture  ED Discharge Orders    None       Note: This dictation was prepared with Dragon dictation along with smaller phrase technology. Any transcriptional errors that result from this process are unintentional.         Marylene Land, NP 08/13/19 1428

## 2019-08-16 DIAGNOSIS — S92352A Displaced fracture of fifth metatarsal bone, left foot, initial encounter for closed fracture: Secondary | ICD-10-CM | POA: Diagnosis not present

## 2019-08-30 ENCOUNTER — Encounter: Payer: Self-pay | Admitting: Family Medicine

## 2019-08-30 ENCOUNTER — Ambulatory Visit (INDEPENDENT_AMBULATORY_CARE_PROVIDER_SITE_OTHER): Payer: 59 | Admitting: Family Medicine

## 2019-08-30 ENCOUNTER — Other Ambulatory Visit: Payer: Self-pay

## 2019-08-30 DIAGNOSIS — J0110 Acute frontal sinusitis, unspecified: Secondary | ICD-10-CM

## 2019-08-30 DIAGNOSIS — Z5321 Procedure and treatment not carried out due to patient leaving prior to being seen by health care provider: Secondary | ICD-10-CM

## 2019-08-30 MED ORDER — FLUTICASONE PROPIONATE 50 MCG/ACT NA SUSP: 2.0000 | Freq: Every day | NASAL | 5 refills | Status: AC

## 2019-08-30 MED ORDER — IPRATROPIUM BROMIDE 0.06 % NA SOLN: 2.0000 | Freq: Four times a day (QID) | NASAL | 0 refills | Status: DC

## 2019-08-30 MED ORDER — AMOXICILLIN-POT CLAVULANATE 875-125 MG PO TABS: 1.0000 | ORAL_TABLET | Freq: Two times a day (BID) | ORAL | 0 refills | Status: DC

## 2019-08-30 NOTE — Telephone Encounter (Signed)
Seen today with virtual apt  Nobie Putnam, [facility] 08/30/2019, 5:18 PM

## 2019-08-30 NOTE — Patient Instructions (Addendum)
1. It sounds like you have a Sinusitis (Bacterial Infection) - this most likely started as an Upper Respiratory Virus that has settled into an infection. Allergies can also cause this. - Start Augmentin 1 pill twice daily (breakfast and dinner, with food and plenty of water) for 10 days, complete entire course, do not stop early even if feeling better - Start Atrovent nasal spray decongestant 2 sprays in each nostril up to 4 times daily for 7 days - Start Resume Loratadine (Claritin) 10mg  daily and Flonase 2 sprays in each nostril daily for next 4-6 weeks, then you may stop and use seasonally or as needed - Recommend using Nasal Saline spray multiple times a day to help flush out congestion and clear sinuses - Improve hydration by drinking plenty of clear fluids (water, gatorade) to reduce secretions and thin congestion - Congestion draining down throat can cause irritation. May try warm herbal tea with honey, cough drops - Can take Tylenol or Ibuprofen as needed for fevers - May continue over the counter cold medicine as you are, I would not use any decongestant or mucinex longer than 7 days.  If you develop persistent fever >101F for at least 3 consecutive days, headaches with sinus pain or pressure or persistent earache, please schedule a follow-up evaluation within next few days to week.   Please schedule a Follow-up Appointment to: Return in about 1 week (around 09/06/2019), or if symptoms worsen or fail to improve, for sinusitis.  If you have any other questions or concerns, please feel free to call the office or send a message through [facility]. You may also schedule an earlier appointment if necessary.  Additionally, you may be receiving a survey about your experience at our office within a few days to 1 week by e-mail or mail. We value your feedback.  Nobie Putnam, DO [facility]

## 2019-08-30 NOTE — Progress Notes (Signed)
Virtual Visit via Telephone The purpose of this virtual visit is to provide medical care while limiting exposure to the novel coronavirus (COVID19) for both patient and office staff.  Consent was obtained for phone visit:  Yes.   Answered questions that patient had about telehealth interaction:  Yes.   I discussed the limitations, risks, security and privacy concerns of performing an evaluation and management service by telephone. I also discussed with the patient that there may be a patient responsible charge related to this service. The patient expressed understanding and agreed to proceed.  Patient Location: Home Provider Location: [facility])  ---------------------------------------------------------------------- Chief Complaint  Patient presents with  . Sinusitis    cough, nasal drainge onset 2 days also she needs refill for Flonase    S: Reviewed CMA documentation. I have called patient and gathered additional HPI as follows:  Sinusitis,  Reports onset 2 days ago with some worsening, more Left sided sinus pain and pressure.  Admits hoarseness of voice, with post nasal drainage. Admits cough, non productive Tried Flonase nasal, Tylenol Sinus, Sudafed, Singulair, Loratadine Took Amoxicillin last month for root canal Typically one flare of sinusitis each year.  Denies any known or suspected exposure to person with or possibly with COVID19.  Denies any fevers, chills, sweats, body ache, cough, shortness of breath, sinus pain or pressure, headache, abdominal pain, diarrhea  UTD COVID19 Pfizer vaccine.  Past Medical History:  Diagnosis Date  . Allergy   . Hypertension   . Osteopenia   . Thyroid disease    Social History   Tobacco Use  . Smoking status: Never Smoker  . Smokeless tobacco: Never Used  Vaping Use  . Vaping Use: Never used  Substance Use Topics  . Alcohol use: Yes  . Drug use: Never    Current Outpatient Medications:  .   Ascorbic Acid (VITA-C PO), Take 500 mg by mouth. , Disp: , Rfl:  .  benazepril (LOTENSIN) 10 MG tablet, Take 1 tablet (10 mg total) by mouth daily., Disp: 90 tablet, Rfl: 1 .  DULoxetine (CYMBALTA) 60 MG capsule, Take 1 capsule (60 mg total) by mouth daily., Disp: 90 capsule, Rfl: 1 .  fluticasone (FLONASE) 50 MCG/ACT nasal spray, Place 2 sprays into both nostrils daily., Disp: 16 g, Rfl: 5 .  levothyroxine (SYNTHROID) 112 MCG tablet, Take 1 tablet (112 mcg total) by mouth daily before breakfast., Disp: 90 tablet, Rfl: 1 .  loratadine (CLARITIN) 10 MG tablet, TAKE 1 TABLET DAILY, Disp: , Rfl:  .  montelukast (SINGULAIR) 10 MG tablet, Take 1 tablet (10 mg total) by mouth at bedtime., Disp: 90 tablet, Rfl: 1 .  Multiple Vitamin (MULTIVITAMIN) tablet, Take 1 tablet by mouth daily., Disp: , Rfl:  .  Probiotic Product (PROBIOTIC-10 PO), Take by mouth., Disp: , Rfl:  .  SUMAtriptan (IMITREX) 50 MG tablet, Take 1 tablet (50 mg total) by mouth once as needed for up to 1 dose for migraine. May repeat one dose in 2 hours if headache persists, for max dose 24 hours, Disp: 10 tablet, Rfl: 2 .  amoxicillin-clavulanate (AUGMENTIN) 875-125 MG tablet, Take 1 tablet by mouth 2 (two) times daily. For 10 days, Disp: 20 tablet, Rfl: 0 .  ipratropium (ATROVENT) 0.06 % nasal spray, Place 2 sprays into both nostrils 4 (four) times daily. For up to 5-7 days then stop., Disp: 15 mL, Rfl: 0  Depression screen [facility] 2/9 06/24/2019 05/27/2019  Decreased Interest 0 0  Down, Depressed, Hopeless 0  0  PHQ - 2 Score 0 0    No flowsheet data found.  -------------------------------------------------------------------------- O: No physical exam performed due to remote telephone encounter.  Lab results reviewed.  Recent Results (from the past 2160 hour(s))  T4, free     Status: None   Collection Time: 06/17/19  7:59 AM  Result Value Ref Range   Free T4 1.5 0.8 - 1.8 ng/dL  TSH     Status: Abnormal   Collection Time: 06/17/19   7:59 AM  Result Value Ref Range   TSH 0.03 (L) 0.40 - 4.50 mIU/L  Lipid panel     Status: Abnormal   Collection Time: 06/17/19  7:59 AM  Result Value Ref Range   Cholesterol 177 <200 mg/dL   HDL 51 > OR = 50 mg/dL   Triglycerides 95 <150 mg/dL   LDL Cholesterol (Calc) 107 (H) mg/dL (calc)    Comment: Reference range: <100 . Desirable range <100 mg/dL for primary prevention;   <70 mg/dL for patients with CHD or diabetic patients  with > or = 2 CHD risk factors. Marland Kitchen LDL-C is now calculated using the Martin-Hopkins  calculation, which is a validated novel method providing  better accuracy than the Friedewald equation in the  estimation of LDL-C.  Cresenciano Genre et al. Annamaria Helling. 1610;960(45): 2061-2068  (http://education.[employer].com/faq/FAQ164)    Total CHOL/HDL Ratio 3.5 <5.0 (calc)   Non-HDL Cholesterol (Calc) 126 <130 mg/dL (calc)    Comment: For patients with diabetes plus 1 major ASCVD risk  factor, treating to a non-HDL-C goal of <100 mg/dL  (LDL-C of <70 mg/dL) is considered a therapeutic  option.   COMPLETE METABOLIC PANEL WITH GFR     Status: None   Collection Time: 06/17/19  7:59 AM  Result Value Ref Range   Glucose, Bld 82 65 - 99 mg/dL    Comment: .            Fasting reference interval .    BUN 20 7 - 25 mg/dL   Creat 0.61 0.50 - 1.05 mg/dL    Comment: For patients >68 years of age, the reference limit for Creatinine is approximately 13% higher for people identified as African-American. .    GFR, Est Non African American 101 > OR = 60 mL/min/1.8m2   GFR, Est African American 117 > OR = 60 mL/min/1.32m2   BUN/Creatinine Ratio NOT APPLICABLE 6 - 22 (calc)   Sodium 136 135 - 146 mmol/L   Potassium 4.5 3.5 - 5.3 mmol/L   Chloride 102 98 - 110 mmol/L   CO2 28 20 - 32 mmol/L   Calcium 9.1 8.6 - 10.4 mg/dL   Total Protein 6.5 6.1 - 8.1 g/dL   Albumin 3.8 3.6 - 5.1 g/dL   Globulin 2.7 1.9 - 3.7 g/dL (calc)   AG Ratio 1.4 1.0 - 2.5 (calc)   Total Bilirubin 0.3  0.2 - 1.2 mg/dL   Alkaline phosphatase (APISO) 79 37 - 153 U/L   AST 21 10 - 35 U/L   ALT 18 6 - 29 U/L  CBC with Differential/Platelet     Status: Abnormal   Collection Time: 06/17/19  7:59 AM  Result Value Ref Range   WBC 5.9 3.8 - 10.8 Thousand/uL   RBC 4.20 3.80 - 5.10 Million/uL   Hemoglobin 12.1 11.7 - 15.5 g/dL   HCT 38.1 35 - 45 %   MCV 90.7 80.0 - 100.0 fL   MCH 28.8 27.0 - 33.0 pg   MCHC 31.8 (L)  32.0 - 36.0 g/dL   RDW 12.0 11.0 - 15.0 %   Platelets 349 140 - 400 Thousand/uL   MPV 10.3 7.5 - 12.5 fL   Neutro Abs 2,649 1,500 - 7,800 cells/uL   Lymphs Abs 2,395 850 - 3,900 cells/uL   Absolute Monocytes 584 200 - 950 cells/uL   Eosinophils Absolute 212 15 - 500 cells/uL   Basophils Absolute 59 0 - 200 cells/uL   Neutrophils Relative % 44.9 %   Total Lymphocyte 40.6 %   Monocytes Relative 9.9 %   Eosinophils Relative 3.6 %   Basophils Relative 1.0 %  Hemoglobin A1c     Status: None   Collection Time: 06/17/19  7:59 AM  Result Value Ref Range   Hgb A1c MFr Bld 5.1 <5.7 % of total Hgb    Comment: For the purpose of screening for the presence of diabetes: . <5.7%       Consistent with the absence of diabetes 5.7-6.4%    Consistent with increased risk for diabetes             (prediabetes) > or =6.5%  Consistent with diabetes . This assay result is consistent with a decreased risk of diabetes. . Currently, no consensus exists regarding use of hemoglobin A1c for diagnosis of diabetes in children. . According to American Diabetes Association (ADA) guidelines, hemoglobin A1c <7.0% represents optimal control in non-pregnant diabetic patients. Different metrics may apply to specific patient populations.  Standards of Medical Care in Diabetes(ADA). .    Mean Plasma Glucose 100 (calc)   eAG (mmol/L) 5.5 (calc)    -------------------------------------------------------------------------- A&P:  Problem List Items Addressed This Visit    None    Visit Diagnoses     Acute non-recurrent frontal sinusitis    -  Primary   Relevant Medications   fluticasone (FLONASE) 50 MCG/ACT nasal spray   amoxicillin-clavulanate (AUGMENTIN) 875-125 MG tablet   ipratropium (ATROVENT) 0.06 % nasal spray     Consistent with acute frontal sinusitis, likely initially viral URI vs allergic rhinitis component with worsening concern for bacterial infection based on severity of her symptoms with pressure and pain  Plan: 1 Start Augmentin 875-125mg  PO BID x 10 days 2. Continue Loratadine (Claritin) 10mg  daily and Flonase 2 sprays in each nostril daily for next 4-6 weeks, then may stop and use seasonally or as needed - Refill Flonase - ADD Start Atrovent nasal spray decongestant 2 sprays in each nostril up to 4 times daily for 7 days  3. Supportive care for sinuses 4. Return criteria reviewed   Meds ordered this encounter  Medications  . fluticasone (FLONASE) 50 MCG/ACT nasal spray    Sig: Place 2 sprays into both nostrils daily.    Dispense:  16 g    Refill:  5  . amoxicillin-clavulanate (AUGMENTIN) 875-125 MG tablet    Sig: Take 1 tablet by mouth 2 (two) times daily. For 10 days    Dispense:  20 tablet    Refill:  0  . ipratropium (ATROVENT) 0.06 % nasal spray    Sig: Place 2 sprays into both nostrils 4 (four) times daily. For up to 5-7 days then stop.    Dispense:  15 mL    Refill:  0    Follow-up: As needed if not improved  Patient verbalizes understanding with the above medical recommendations including the limitation of remote medical advice.  Specific follow-up and call-back criteria were given for patient to follow-up or seek medical care more urgently if  needed.   - Time spent in direct consultation with patient on phone: 9 minutes   Nobie Putnam, [facility] 08/30/2019, 1:41 PM

## 2019-08-31 NOTE — Progress Notes (Signed)
Duplicate appointment, cancelled, patient was rescheduled  Tanya Barry, [facility] 08/31/2019, 8:06 AM

## 2019-09-14 DIAGNOSIS — S92352D Displaced fracture of fifth metatarsal bone, left foot, subsequent encounter for fracture with routine healing: Secondary | ICD-10-CM | POA: Diagnosis not present

## 2019-09-14 DIAGNOSIS — X500XXD Overexertion from strenuous movement or load, subsequent encounter: Secondary | ICD-10-CM | POA: Diagnosis not present

## 2019-09-14 DIAGNOSIS — X500XXA Overexertion from strenuous movement or load, initial encounter: Secondary | ICD-10-CM | POA: Diagnosis not present

## 2019-09-14 DIAGNOSIS — S92352A Displaced fracture of fifth metatarsal bone, left foot, initial encounter for closed fracture: Secondary | ICD-10-CM | POA: Diagnosis not present

## 2019-09-27 ENCOUNTER — Ambulatory Visit: Payer: 59 | Admitting: Dermatology

## 2019-09-27 ENCOUNTER — Other Ambulatory Visit: Payer: Self-pay

## 2019-09-27 DIAGNOSIS — L720 Epidermal cyst: Secondary | ICD-10-CM | POA: Diagnosis not present

## 2019-09-27 DIAGNOSIS — D485 Neoplasm of uncertain behavior of skin: Secondary | ICD-10-CM

## 2019-09-27 NOTE — Patient Instructions (Signed)

## 2019-09-27 NOTE — Progress Notes (Signed)
   New Patient Visit  Subjective  Tanya Barry is a 54 y.o. female who presents for the following: Lesion.  Patient here today to have a spot at the left arm looked at. Present for about 2 months and is sore to the touch.   The following portions of the chart were reviewed this encounter and updated as appropriate:      Review of Systems:  No other skin or systemic complaints except as noted in HPI or Assessment and Plan.  Objective  Well appearing patient in no apparent distress; mood and affect are within normal limits.  A focused examination was performed including left arm. Relevant physical exam findings are noted in the Assessment and Plan.  Objective  Left Forearm below elbow: 4mm pink firm papule      Assessment & Plan  Neoplasm of uncertain behavior of skin Left Forearm below elbow  Skin / nail biopsy Type of biopsy: tangential   Informed consent: discussed and consent obtained   Patient was prepped and draped in usual sterile fashion: Area prepped with alcohol. Anesthesia: the lesion was anesthetized in a standard fashion   Anesthetic:  1% lidocaine w/ epinephrine 1-100,000 buffered w/ 8.4% NaHCO3 Instrument used: flexible razor blade   Hemostasis achieved with: pressure, aluminum chloride and electrodesiccation   Outcome: patient tolerated procedure well   Post-procedure details: wound care instructions given   Post-procedure details comment:  Ointment and small bandage applied  Specimen 1 - Surgical pathology Differential Diagnosis: Cyst vs Dermatofibroma r/o BCC Check Margins: No 60mm pink firm papule  Return if symptoms worsen or fail to improve.  Graciella Belton, RMA, am acting as scribe for Brendolyn Patty, MD . Documentation: I have reviewed the above documentation for accuracy and completeness, and I agree with the above.  Brendolyn Patty MD

## 2019-10-03 ENCOUNTER — Telehealth: Payer: Self-pay

## 2019-10-03 NOTE — Telephone Encounter (Signed)
-----   Message from Brendolyn Patty, MD sent at 10/03/2019 10:36 AM EDT ----- Skin , left forearm below elbow EPIDERMOID CYST, INFLAMED  Inflamed cyst- benign

## 2019-10-03 NOTE — Telephone Encounter (Signed)
Advised pt of bx results/sh ?

## 2019-10-24 ENCOUNTER — Ambulatory Visit: Payer: 59 | Admitting: Dermatology

## 2019-12-16 ENCOUNTER — Other Ambulatory Visit: Payer: 59

## 2019-12-16 ENCOUNTER — Other Ambulatory Visit: Payer: Self-pay | Admitting: *Deleted

## 2019-12-16 DIAGNOSIS — E039 Hypothyroidism, unspecified: Secondary | ICD-10-CM

## 2019-12-19 ENCOUNTER — Other Ambulatory Visit: Payer: 59

## 2019-12-19 ENCOUNTER — Other Ambulatory Visit: Payer: Self-pay

## 2019-12-19 DIAGNOSIS — E039 Hypothyroidism, unspecified: Secondary | ICD-10-CM | POA: Diagnosis not present

## 2019-12-19 LAB — TSH: TSH: 0.77 mIU/L (ref 0.40–4.50)

## 2019-12-19 LAB — T4, FREE: Free T4: 1.2 ng/dL (ref 0.8–1.8)

## 2019-12-20 ENCOUNTER — Other Ambulatory Visit: Payer: Self-pay | Admitting: Family Medicine

## 2019-12-20 DIAGNOSIS — I10 Essential (primary) hypertension: Secondary | ICD-10-CM

## 2019-12-20 NOTE — Telephone Encounter (Signed)
Requested Prescriptions  Pending Prescriptions Disp Refills   benazepril (LOTENSIN) 10 MG tablet [Pharmacy Med Name: BENAZEPRIL HCL 10 MG TABLET 10 Tablet] 90 tablet 0    Sig: TAKE 1 TABLET (10 MG TOTAL) BY MOUTH DAILY.     Cardiovascular:  ACE Inhibitors Failed - 12/20/2019 12:50 PM      Failed - Cr in normal range and within 180 days    Creat  Date Value Ref Range Status  06/17/2019 0.61 0.50 - 1.05 mg/dL Final    Comment:    For patients >56 years of age, the reference limit for Creatinine is approximately 13% higher for people identified as African-American. .          Failed - K in normal range and within 180 days    Potassium  Date Value Ref Range Status  06/17/2019 4.5 3.5 - 5.3 mmol/L Final         Failed - Last BP in normal range    BP Readings from Last 1 Encounters:  08/13/19 (!) 132/94         Passed - Patient is not pregnant      Passed - Valid encounter within last 6 months    Recent Outpatient Visits          3 months ago Acute non-recurrent frontal sinusitis   [place], DO   3 months ago Patient left without being seen   [place], DO   5 months ago Annual physical exam   [facility] Olin Hauser, DO   6 months ago Acquired hypothyroidism   [place], Devonne Doughty, [place]

## 2019-12-23 ENCOUNTER — Ambulatory Visit: Payer: 59 | Admitting: Family Medicine

## 2020-03-29 ENCOUNTER — Other Ambulatory Visit: Payer: Self-pay | Admitting: Family Medicine

## 2020-03-29 ENCOUNTER — Other Ambulatory Visit: Payer: Self-pay

## 2020-03-29 DIAGNOSIS — I10 Essential (primary) hypertension: Secondary | ICD-10-CM

## 2020-03-29 MED ORDER — BENAZEPRIL HCL 10 MG PO TABS: 10.0000 mg | ORAL_TABLET | Freq: Every day | ORAL | 0 refills | Status: DC

## 2020-04-16 ENCOUNTER — Other Ambulatory Visit: Payer: Self-pay | Admitting: Family Medicine

## 2020-04-16 DIAGNOSIS — M4325 Fusion of spine, thoracolumbar region: Secondary | ICD-10-CM

## 2020-04-16 DIAGNOSIS — E039 Hypothyroidism, unspecified: Secondary | ICD-10-CM

## 2020-04-16 DIAGNOSIS — J302 Other seasonal allergic rhinitis: Secondary | ICD-10-CM

## 2020-04-16 DIAGNOSIS — M4807 Spinal stenosis, lumbosacral region: Secondary | ICD-10-CM

## 2020-04-16 NOTE — Telephone Encounter (Signed)
appt

## 2020-04-17 ENCOUNTER — Other Ambulatory Visit: Payer: Self-pay | Admitting: Unknown Physician Specialty

## 2020-04-17 NOTE — Telephone Encounter (Signed)
The pt was scheduled for an appt with Dr. Raliegh Ip on Tuesday, Feb. 22nd.

## 2020-05-01 ENCOUNTER — Telehealth (INDEPENDENT_AMBULATORY_CARE_PROVIDER_SITE_OTHER): Payer: 59 | Admitting: Family Medicine

## 2020-05-01 ENCOUNTER — Other Ambulatory Visit: Payer: Self-pay | Admitting: Family Medicine

## 2020-05-01 ENCOUNTER — Encounter: Payer: Self-pay | Admitting: Family Medicine

## 2020-05-01 ENCOUNTER — Other Ambulatory Visit: Payer: Self-pay

## 2020-05-01 DIAGNOSIS — M4807 Spinal stenosis, lumbosacral region: Secondary | ICD-10-CM | POA: Diagnosis not present

## 2020-05-01 DIAGNOSIS — M4325 Fusion of spine, thoracolumbar region: Secondary | ICD-10-CM

## 2020-05-01 DIAGNOSIS — I10 Essential (primary) hypertension: Secondary | ICD-10-CM

## 2020-05-01 DIAGNOSIS — G43109 Migraine with aura, not intractable, without status migrainosus: Secondary | ICD-10-CM | POA: Diagnosis not present

## 2020-05-01 DIAGNOSIS — J302 Other seasonal allergic rhinitis: Secondary | ICD-10-CM

## 2020-05-01 DIAGNOSIS — Z0000 Encounter for general adult medical examination without abnormal findings: Secondary | ICD-10-CM

## 2020-05-01 DIAGNOSIS — R7309 Other abnormal glucose: Secondary | ICD-10-CM

## 2020-05-01 DIAGNOSIS — E039 Hypothyroidism, unspecified: Secondary | ICD-10-CM | POA: Diagnosis not present

## 2020-05-01 DIAGNOSIS — E7800 Pure hypercholesterolemia, unspecified: Secondary | ICD-10-CM

## 2020-05-01 MED ORDER — DULOXETINE HCL 60 MG PO CPEP: 60.0000 mg | ORAL_CAPSULE | Freq: Every day | ORAL | 3 refills | Status: DC

## 2020-05-01 MED ORDER — MONTELUKAST SODIUM 10 MG PO TABS
10.0000 mg | ORAL_TABLET | Freq: Every day | ORAL | 3 refills | Status: DC
Start: 2020-05-01 — End: 2020-05-01

## 2020-05-01 MED ORDER — LEVOTHYROXINE SODIUM 112 MCG PO TABS: 112.0000 ug | ORAL_TABLET | Freq: Every day | ORAL | 3 refills | Status: DC

## 2020-05-01 MED ORDER — SUMATRIPTAN SUCCINATE 50 MG PO TABS: 50.0000 mg | ORAL_TABLET | Freq: Once | ORAL | 2 refills | Status: DC | PRN

## 2020-05-01 MED ORDER — BENAZEPRIL HCL 10 MG PO TABS: 10.0000 mg | ORAL_TABLET | Freq: Every day | ORAL | 3 refills | Status: DC

## 2020-05-01 NOTE — Patient Instructions (Addendum)
Refilled all medications.  DUE for FASTING BLOOD WORK (no food or drink after midnight before the lab appointment, only water or coffee without cream/sugar on the morning of)  SCHEDULE "Lab Only" visit in the morning at the clinic for lab draw in 2-3 MONTHS  - Make sure Lab Only appointment is at about 1 week before your next appointment, so that results will be available  For Lab Results, once available within 2-3 days of blood draw, you can can log in to MyChart online to view your results and a brief explanation. Also, we can discuss results at next follow-up visit.   Please schedule a Follow-up Appointment to: Return in about 2 months (around 06/29/2020) for 2 month approx for Annual Physical and fasting lab orders in..  If you have any other questions or concerns, please feel free to call the office or send a message through [place]. You may also schedule an earlier appointment if necessary.  Additionally, you may be receiving a survey about your experience at our office within a few days to 1 week by e-mail or mail. We value your feedback.  Nobie Putnam, DO [place]

## 2020-05-01 NOTE — Progress Notes (Signed)
Subjective:    Patient ID: Tanya Barry, female    DOB: 04-Jul-1965, 55 y.o.   MRN: [id number]  Tanya Barry is a 55 y.o. female presenting on 05/01/2020 for Hypertension, Hypothyroidism, and Back Pain  Virtual / Telehealth Encounter - Video Visit via MyChart The purpose of this virtual visit is to provide medical care while limiting exposure to the novel coronavirus (COVID19) for both patient and office staff.  Consent was obtained for remote visit:  Yes.   Answered questions that patient had about telehealth interaction:  Yes.   I discussed the limitations, risks, security and privacy concerns of performing an evaluation and management service by video/telephone. I also discussed with the patient that there may be a patient responsible charge related to this service. The patient expressed understanding and agreed to proceed.  Patient Location: Home Provider Location: [facility] ([facility])  Participants in virtual visit: - Patient: Tanya Barry - CMA: Orinda Kenner, CMA - Provider: Dr Parks Ranger   HPI   Migraine Headaches Chronic history of migraine headaches for most of life. She has done well lately. Last visit reordered medicine. Did not need Sumatriptan Imitrex, med to expire in 05/2020, will re order  Allergic Rhinosinusitis / Seasonal Allergies Refill due on Singulair  Hypothyroidism Chronic issue, has caused weight gain.Says this was onset after 2009. Has been steady on dose levothyroxine 100 to 150 approx, has not had many changes Last labs were 06/2019 showed mild low TSH 0.03, and normal free t4 1.5 She is on Levothyroxine 120mcg daily, has no complaints today  Chronic Back and Neck Pain / Osteoarthritis / DJD with thoracolumbar spinal stenosis History of traumatic injury 2009 thrown off horse and broke her back, had a laminectomy surgery w hardware T12-L1, ultimately developed issues with hypertension and had chest pain and cardiac  symptoms after this injury and HTN, she had a cardiac catheterization and ultimately no blockages, they determined anginal symptoms were triggered by "bruising" on the heart.  Taking Cymbalta 60mg  daily, for about past 10 years for chronic back pain. Helps with her aching pain, takes in PM. Also helps her feet, can get some numbness in toes if stand too long. - Cervical Lumbar ESI injections from Dr Oleta Mouse, likely last visit in 2018 has done well.  CHRONIC HTN: Reportshistory of controlled HTN on current med. No missed meds. Has refill recently Doing well normal BP today Current Meds -Benazepril 10mg  daily Reports good compliance, took meds today. Tolerating well, w/o complaints. Denies CP, dyspnea, HA, edema, dizziness / lightheadedness   Health Maintenance:  History of s/pTotal Hysterectomy 1995, no pap smear since 2000  Breast Cancer Screenign: Last mammogram 09/17/18 ([facility]) reviewed record, copied below.  Colon CA Screening: LastColonoscopy 08/21/15(Dr Rollene Rotunda [facility]) negative no polyps reportedly good for 10 years, will request record - still did not receive since last time.  Depression screen [facility] 2/9 06/24/2019 05/27/2019  Decreased Interest 0 0  Down, Depressed, Hopeless 0 0  PHQ - 2 Score 0 0    Social History   Tobacco Use  . Smoking status: Never Smoker  . Smokeless tobacco: Never Used  Vaping Use  . Vaping Use: Never used  Substance Use Topics  . Alcohol use: Yes  . Drug use: Never    Review of Systems Per HPI unless specifically indicated above     Objective:    Ht 5\' 4"  (1.626 m)   Wt 192 lb (87.1 kg)   BMI 32.96  kg/m   Wt Readings from Last 3 Encounters:  05/01/20 192 lb (87.1 kg)  08/13/19 198 lb (89.8 kg)  06/24/19 202 lb 9.6 oz (91.9 kg)    Physical Exam   Note examination was completely remotely via video observation objective data only  Gen - well-appearing, no acute distress or apparent pain, comfortable HEENT - eyes appear clear  without discharge or redness Heart/Lungs - cannot examine virtually - observed no evidence of coughing or labored breathing. Abd - cannot examine virtually  Skin - face visible today- no rash Neuro - awake, alert, oriented Psych - not anxious appearing   Results for orders placed or performed in visit on 12/16/19  T4, free  Result Value Ref Range   Free T4 1.2 0.8 - 1.8 ng/dL  TSH  Result Value Ref Range   TSH 0.77 0.40 - 4.50 mIU/L      Assessment & Plan:   Problem List Items Addressed This Visit    Stenosis of lumbosacral spine   Relevant Medications   DULoxetine (CYMBALTA) 60 MG capsule   Seasonal allergies   Relevant Medications   montelukast (SINGULAIR) 10 MG tablet   Migraine with aura and without status migrainosus, not intractable   Relevant Medications   DULoxetine (CYMBALTA) 60 MG capsule   benazepril (LOTENSIN) 10 MG tablet   SUMAtriptan (IMITREX) 50 MG tablet   Fusion of spine of thoracolumbar region   Relevant Medications   DULoxetine (CYMBALTA) 60 MG capsule   Essential hypertension   Relevant Medications   benazepril (LOTENSIN) 10 MG tablet   Acquired hypothyroidism   Relevant Medications   levothyroxine (SYNTHROID) 112 MCG tablet      Medication refilled today See above Chronic Lumbar Back Pain / Spinal Stenosis - controlled on Duloxetine, will re order add refills S/p Lumbar fusion  HTN Controlled on Benazepril - add refills today, 10mg  dailiy  Hypothyroidism Due for upcoming labs Last lab 12/2019, TSH 0.77 and T4 1.2 Orders in for lab and refill current dosage Levothyroxine 149mcg daily  Migraine headaches Controlled Not using sumatriptan, will expire in 05/2020, will re order now  Meds ordered this encounter  Medications  . DULoxetine (CYMBALTA) 60 MG capsule    Sig: Take 1 capsule (60 mg total) by mouth daily.    Dispense:  90 capsule    Refill:  3    Keep additional refills on file  . levothyroxine (SYNTHROID) 112 MCG tablet     Sig: Take 1 tablet (112 mcg total) by mouth daily before breakfast.    Dispense:  90 tablet    Refill:  3    Keep additional refills on file  . montelukast (SINGULAIR) 10 MG tablet    Sig: Take 1 tablet (10 mg total) by mouth at bedtime.    Dispense:  90 tablet    Refill:  3    Keep additional refills on file  . benazepril (LOTENSIN) 10 MG tablet    Sig: Take 1 tablet (10 mg total) by mouth daily.    Dispense:  90 tablet    Refill:  3    Keep additional refills on file  . SUMAtriptan (IMITREX) 50 MG tablet    Sig: Take 1 tablet (50 mg total) by mouth once as needed for up to 1 dose for migraine. May repeat one dose in 2 hours if headache persists, for max dose 24 hours    Dispense:  10 tablet    Refill:  2    Refill on  file, if needed.     Follow up plan: Return in about 2 months (around 06/29/2020) for 2 month approx for Annual Physical and fasting lab orders in..  Future labs ordered for 06/2020 - physical labs, she will schedule lab only apt and physical upcoming  Patient verbalizes understanding with the above medical recommendations including the limitation of remote medical advice.  Specific follow-up and call-back criteria were given for patient to follow-up or seek medical care more urgently if needed.  Total duration of direct patient care provided via video conference: 10 minutes    Nobie Putnam, [facility] 05/01/2020, 2:58 PM

## 2020-07-06 ENCOUNTER — Ambulatory Visit: Payer: 59 | Admitting: Family Medicine

## 2020-07-06 ENCOUNTER — Encounter: Payer: Self-pay | Admitting: Family Medicine

## 2020-07-06 ENCOUNTER — Other Ambulatory Visit: Payer: Self-pay

## 2020-07-06 VITALS — BP 130/68 | HR 63 | Temp 98.0°F | Ht 64.0 in | Wt 204.0 lb

## 2020-07-06 DIAGNOSIS — N3001 Acute cystitis with hematuria: Secondary | ICD-10-CM

## 2020-07-06 LAB — POCT URINALYSIS DIPSTICK
Bilirubin, UA: NEGATIVE
Glucose, UA: NEGATIVE
Ketones, UA: NEGATIVE
Leukocytes, UA: NEGATIVE
Nitrite, UA: NEGATIVE
Protein, UA: NEGATIVE
Spec Grav, UA: 1.015 (ref 1.010–1.025)
Urobilinogen, UA: 0.2 E.U./dL
pH, UA: 5 (ref 5.0–8.0)

## 2020-07-06 MED ORDER — CEPHALEXIN 500 MG PO CAPS
500.0000 mg | ORAL_CAPSULE | Freq: Three times a day (TID) | ORAL | 0 refills | Status: DC
  Filled 2020-07-06: qty 21, 7d supply, fill #0

## 2020-07-06 NOTE — Patient Instructions (Addendum)
Thank you for coming to the office today.  1. You have a Urinary Tract Infection - this is very common, your symptoms are reassuring and you should get better within 1 week on the antibiotics - Start Keflex 500mg 3 times daily for next 7 days, complete entire course, even if feeling better - We sent urine for a culture, we will call you within next few days if we need to change antibiotics - Please drink plenty of fluids, improve hydration over next 1 week  If symptoms worsening, developing nausea / vomiting, worsening back pain, fevers / chills / sweats, then please return for re-evaluation sooner.  If you take AZO OTC - limit this to 2-3 days MAX to avoid affecting kidneys  D-Mannose is a natural supplement that can actually help bind to urinary bacteria and reduce their effectiveness it can help prevent UTI from forming, and may reduce some symptoms. It likely cannot cure an active UTI but it is worth a try and good to prevent them with. Try 500mg twice a day at a full dose if you want, or check package instructions for more info   Please schedule a Follow-up Appointment to: Return if symptoms worsen or fail to improve.  If you have any other questions or concerns, please feel free to call the office or send a message through MyChart. You may also schedule an earlier appointment if necessary.  Additionally, you may be receiving a survey about your experience at our office within a few days to 1 week by e-mail or mail. We value your feedback.  Blondell Laperle, DO [facility], [facility] 

## 2020-07-06 NOTE — Progress Notes (Signed)
Subjective:    Patient ID: Tanya Barry, female    DOB: 12/21/1965, 55 y.o.   MRN: [id number]  Tanya Barry is a 55 y.o. female presenting on 07/06/2020 for Urinary Tract Infection (X1 days, pain and pressure, no burning, no itching )  Patient presents for a same day appointment.  HPI   UTI Reports x 1 day of lower abdominal pain, had some mild low grade temp 99.12F, and bladder fullness and pressure, urinary urgency and frequency but without dysuria. - No recent UTIs. Previously maybe >5 years ago. No urine test recently. Denies any nausea vomiting, flank or back pain.   Depression screen [facility] 2/9 07/06/2020 06/24/2019 05/27/2019  Decreased Interest 0 0 0  Down, Depressed, Hopeless 0 0 0  PHQ - 2 Score 0 0 0  Altered sleeping 0 - -  Tired, decreased energy 0 - -  Change in appetite 0 - -  Feeling bad or failure about yourself  0 - -  Trouble concentrating 0 - -  Moving slowly or fidgety/restless 0 - -  Suicidal thoughts 0 - -  PHQ-9 Score 0 - -    Social History   Tobacco Use  . Smoking status: Never Smoker  . Smokeless tobacco: Never Used  Vaping Use  . Vaping Use: Never used  Substance Use Topics  . Alcohol use: Yes  . Drug use: Never    Review of Systems Per HPI unless specifically indicated above     Objective:    BP 130/68   Pulse 63   Temp 98 F (36.7 C) (Oral)   Ht 5\' 4"  (1.626 m)   Wt 204 lb (92.5 kg)   SpO2 100%   BMI 35.02 kg/m   Wt Readings from Last 3 Encounters:  07/06/20 204 lb (92.5 kg)  05/01/20 192 lb (87.1 kg)  08/13/19 198 lb (89.8 kg)    Physical Exam Vitals and nursing note reviewed.  Constitutional:      General: She is not in acute distress.    Appearance: She is well-developed. She is not diaphoretic.     Comments: Well-appearing, comfortable, cooperative  HENT:     Head: Normocephalic and atraumatic.  Eyes:     General:        Right eye: No discharge.        Left eye: No discharge.     Conjunctiva/sclera:  Conjunctivae normal.  Cardiovascular:     Rate and Rhythm: Normal rate.  Pulmonary:     Effort: Pulmonary effort is normal.  Skin:    General: Skin is warm and dry.     Findings: No erythema or rash.  Neurological:     Mental Status: She is alert and oriented to person, place, and time.  Psychiatric:        Behavior: Behavior normal.     Comments: Well groomed, good eye contact, normal speech and thoughts    Results for orders placed or performed in visit on 07/06/20  POCT urinalysis dipstick  Result Value Ref Range   Color, UA auburn    Clarity, UA clear    Glucose, UA Negative Negative   Bilirubin, UA neg    Ketones, UA neg    Spec Grav, UA 1.015 1.010 - 1.025   Blood, UA trace    pH, UA 5.0 5.0 - 8.0   Protein, UA Negative Negative   Urobilinogen, UA 0.2 0.2 or 1.0 E.U./dL   Nitrite, UA neg    Leukocytes, UA Negative  Negative   Appearance auburn,clear    Odor none       Assessment & Plan:   Problem List Items Addressed This Visit   None   Visit Diagnoses    Acute cystitis with hematuria    -  Primary   Relevant Medications   cephALEXin (KEFLEX) 500 MG capsule   Other Relevant Orders   POCT urinalysis dipstick (Completed)   Urine Culture      Clinically consistent with UTI and confirmed on UA. No recent UTIs or abx courses.  No concern for pyelo today (no systemic symptoms, neg fever, back pain, n/v).  Plan: 1. UA rbcs 2. Ordered Urine culture 3. Keflex 500mg  TID x 7 days 4. Improve PO hydration 5. RTC if no improvement 1-2 weeks, red flags given to return sooner   Meds ordered this encounter  Medications  . cephALEXin (KEFLEX) 500 MG capsule    Sig: Take 1 capsule (500 mg total) by mouth 3 (three) times daily. For 7 days    Dispense:  21 capsule    Refill:  0      Follow up plan: Return if symptoms worsen or fail to improve.    Tanya Barry, [facility] 07/06/2020, 3:25 PM

## 2020-07-07 LAB — URINE CULTURE
MICRO NUMBER:: 11831705
SPECIMEN QUALITY:: ADEQUATE

## 2020-07-13 ENCOUNTER — Other Ambulatory Visit: Payer: Self-pay

## 2020-07-13 MED FILL — Benazepril HCl Tab 10 MG: ORAL | 5 days supply | Qty: 5 | Fill #0 | Status: AC

## 2020-07-13 MED FILL — Benazepril HCl Tab 10 MG: ORAL | 85 days supply | Qty: 85 | Fill #0 | Status: AC

## 2020-07-16 ENCOUNTER — Other Ambulatory Visit: Payer: Self-pay

## 2020-07-18 ENCOUNTER — Other Ambulatory Visit: Payer: Self-pay

## 2020-07-18 MED FILL — Duloxetine HCl Enteric Coated Pellets Cap 60 MG (Base Eq): ORAL | 90 days supply | Qty: 90 | Fill #0 | Status: AC

## 2020-08-27 ENCOUNTER — Other Ambulatory Visit: Payer: Self-pay

## 2020-08-27 MED FILL — Montelukast Sodium Tab 10 MG (Base Equiv): ORAL | 90 days supply | Qty: 90 | Fill #0 | Status: AC

## 2020-10-09 ENCOUNTER — Other Ambulatory Visit: Payer: Self-pay

## 2020-10-09 MED FILL — Duloxetine HCl Enteric Coated Pellets Cap 60 MG (Base Eq): ORAL | 90 days supply | Qty: 90 | Fill #1 | Status: AC

## 2020-10-09 MED FILL — Benazepril HCl Tab 10 MG: ORAL | 90 days supply | Qty: 90 | Fill #1 | Status: AC

## 2020-10-09 MED FILL — Levothyroxine Sodium Tab 112 MCG: ORAL | 90 days supply | Qty: 90 | Fill #0 | Status: AC

## 2020-12-17 ENCOUNTER — Other Ambulatory Visit: Payer: Self-pay

## 2020-12-17 MED FILL — Montelukast Sodium Tab 10 MG (Base Equiv): ORAL | 90 days supply | Qty: 90 | Fill #1 | Status: CN

## 2021-01-04 ENCOUNTER — Other Ambulatory Visit: Payer: Self-pay

## 2021-01-07 ENCOUNTER — Other Ambulatory Visit: Payer: Self-pay

## 2021-01-07 MED FILL — Montelukast Sodium Tab 10 MG (Base Equiv): ORAL | 90 days supply | Qty: 90 | Fill #1 | Status: AC

## 2021-01-18 ENCOUNTER — Other Ambulatory Visit: Payer: Self-pay

## 2021-01-18 MED FILL — Levothyroxine Sodium Tab 112 MCG: ORAL | 90 days supply | Qty: 90 | Fill #1 | Status: AC

## 2021-01-18 MED FILL — Benazepril HCl Tab 10 MG: ORAL | 90 days supply | Qty: 90 | Fill #2 | Status: AC

## 2021-01-18 MED FILL — Duloxetine HCl Enteric Coated Pellets Cap 60 MG (Base Eq): ORAL | 90 days supply | Qty: 90 | Fill #2 | Status: AC

## 2021-04-19 ENCOUNTER — Encounter: Payer: Self-pay | Admitting: Family Medicine

## 2021-04-19 ENCOUNTER — Other Ambulatory Visit: Payer: Self-pay

## 2021-04-19 ENCOUNTER — Ambulatory Visit (INDEPENDENT_AMBULATORY_CARE_PROVIDER_SITE_OTHER): Payer: 59 | Admitting: Family Medicine

## 2021-04-19 VITALS — BP 119/81 | HR 82 | Ht 64.0 in | Wt 207.8 lb

## 2021-04-19 DIAGNOSIS — Z1231 Encounter for screening mammogram for malignant neoplasm of breast: Secondary | ICD-10-CM | POA: Diagnosis not present

## 2021-04-19 DIAGNOSIS — E7800 Pure hypercholesterolemia, unspecified: Secondary | ICD-10-CM

## 2021-04-19 DIAGNOSIS — M4807 Spinal stenosis, lumbosacral region: Secondary | ICD-10-CM

## 2021-04-19 DIAGNOSIS — Z1211 Encounter for screening for malignant neoplasm of colon: Secondary | ICD-10-CM

## 2021-04-19 DIAGNOSIS — E669 Obesity, unspecified: Secondary | ICD-10-CM | POA: Diagnosis not present

## 2021-04-19 DIAGNOSIS — Z0000 Encounter for general adult medical examination without abnormal findings: Secondary | ICD-10-CM

## 2021-04-19 DIAGNOSIS — I10 Essential (primary) hypertension: Secondary | ICD-10-CM

## 2021-04-19 DIAGNOSIS — J302 Other seasonal allergic rhinitis: Secondary | ICD-10-CM

## 2021-04-19 DIAGNOSIS — R7309 Other abnormal glucose: Secondary | ICD-10-CM

## 2021-04-19 DIAGNOSIS — Z23 Encounter for immunization: Secondary | ICD-10-CM | POA: Diagnosis not present

## 2021-04-19 DIAGNOSIS — G43109 Migraine with aura, not intractable, without status migrainosus: Secondary | ICD-10-CM | POA: Diagnosis not present

## 2021-04-19 DIAGNOSIS — M4325 Fusion of spine, thoracolumbar region: Secondary | ICD-10-CM

## 2021-04-19 DIAGNOSIS — S40811A Abrasion of right upper arm, initial encounter: Secondary | ICD-10-CM | POA: Diagnosis not present

## 2021-04-19 DIAGNOSIS — E039 Hypothyroidism, unspecified: Secondary | ICD-10-CM | POA: Diagnosis not present

## 2021-04-19 NOTE — Progress Notes (Signed)
Subjective:    Patient ID: Tanya Barry, female    DOB: May 23, 1965, 56 y.o.   MRN: [id number]  Tanya Barry is a 56 y.o. female presenting on 04/19/2021 for Annual Exam   HPI   Here for Annual Physical and Lab Orders   Migraine Headaches Chronic history of migraine headaches for most of life. She has done well Currently no recent concerns, seem improved w/ job change and less stress   Allergic Rhinosinusitis / Seasonal Allergies   Hypothyroidism Stable chronic issue since 2009 Doing well with Levothyroxine 152mcg daily Due for labs  Chronic Back and Neck Pain / Osteoarthritis / DJD with thoracolumbar spinal stenosis History of traumatic injury 2009 thrown off horse and broke her back, had a laminectomy surgery w hardware T12-L1, ultimately developed issues with hypertension and had chest pain and cardiac symptoms after this injury and HTN, she had a cardiac catheterization and ultimately no blockages, they determined anginal symptoms were triggered by "bruising" on the heart.   Taking Cymbalta 60mg  daily, for about past 10 years for chronic back pain. Helps with her aching pain, takes in PM. Also helps her feet, can get some numbness in toes if stand too long. - Cervical Lumbar ESI injections from Dr Oleta Mouse, previously  CHRONIC HTN: Controlled BP Current Meds - Benazepril 10mg  daily   Reports good compliance, took meds today. Tolerating well, w/o complaints. Lifestyle: - Exercise: walking with dog regularly, limited gym exercise due to hardware in back. Denies CP, dyspnea, HA, edema, dizziness / lightheadedness     Health Maintenance:   Tdap today, she had abrasion on R arm from chicken coop  Shingrix vaccine 1st dose today. Return 2-6 months  History of s/p Total Hysterectomy 1995, no pap smear since 2000   Breast Cancer Screenign: Last mammogram 09/17/18 ([facility]) reviewed record, need updated Mammo at [place] Screening: Last Colonoscopy 08/21/15 (Dr Rollene [facility]) negative no polyps reportedly good for 10 years, will request record - still did not receive since last time. - She is interested in Cologuard screening now in interval  Depression screen [facility] 2/9 07/06/2020 06/24/2019 05/27/2019  Decreased Interest 0 0 0  Down, Depressed, Hopeless 0 0 0  PHQ - 2 Score 0 0 0  Altered sleeping 0 - -  Tired, decreased energy 0 - -  Change in appetite 0 - -  Feeling bad or failure about yourself  0 - -  Trouble concentrating 0 - -  Moving slowly or fidgety/restless 0 - -  Suicidal thoughts 0 - -  PHQ-9 Score 0 - -    Past Medical History:  Diagnosis Date   Allergy    Hypertension    Osteopenia    Thyroid disease    Past Surgical History:  Procedure Laterality Date   ELBOW SURGERY Right 2005   Cubital Tunnel Nerve Release   LAMINECTOMY  2009   Laminectomy T12 fusion, with hardware rods/screws from T12-L1   TOTAL ABDOMINAL HYSTERECTOMY  1995   Total hysterectomy   Social History   Socioeconomic History   Marital status: Single    Spouse name: Not on file   Number of children: Not on file   Years of education: Not on file   Highest education level: Not on file  Occupational History   Not on file  Tobacco Use   Smoking status: Never   Smokeless tobacco: Never  Vaping Use   Vaping Use: Never used  Substance and Sexual  Activity   Alcohol use: Yes   Drug use: Never   Sexual activity: Not on file  Other Topics Concern   Not on file  Social History Narrative   Not on file   Social Determinants of Health   Financial Resource Strain: Not on file  Food Insecurity: Not on file  Transportation Needs: Not on file  Physical Activity: Not on file  Stress: Not on file  Social Connections: Not on file  Intimate Partner Violence: Not on file   Family History  Problem Relation Age of Onset   Heart disease Mother    Stroke Mother    Thyroid disease Mother    Alzheimer's disease Mother        living in [place] disease  Father    Cancer Father        prostate    Aneurysm Father        brain   Current Outpatient Medications on File Prior to Visit  Medication Sig   Ascorbic Acid (VITA-C PO) Take 500 mg by mouth.    benazepril (LOTENSIN) 10 MG tablet TAKE 1 TABLET BY MOUTH DAILY.   DULoxetine (CYMBALTA) 60 MG capsule TAKE 1 CAPSULE (60 MG TOTAL) BY MOUTH DAILY.   fluticasone (FLONASE) 50 MCG/ACT nasal spray Place 2 sprays into both nostrils daily.   levothyroxine (SYNTHROID) 112 MCG tablet TAKE 1 TABLET BY MOUTH DAILY BEFORE BREAKFAST.   loratadine (CLARITIN) 10 MG tablet TAKE 1 TABLET DAILY   montelukast (SINGULAIR) 10 MG tablet TAKE 1 TABLET BY MOUTH AT BEDTIME.   Multiple Vitamin (MULTIVITAMIN) tablet Take 1 tablet by mouth daily.   Probiotic Product (PROBIOTIC-10 PO) Take by mouth.   SUMAtriptan (IMITREX) 50 MG tablet TAKE 1 TABLET BY MOUTH ONCE AS NEEDED FOR UP TO 1 DOSE FOR MIGRAINE. MAY REPEAT ONE DOSE IN 2 HOURS IF HEADACHE PERSISTS, FOR MAX DOSE   No current facility-administered medications on file prior to visit.    Review of Systems  Constitutional:  Negative for activity change, appetite change, chills, diaphoresis, fatigue and fever.  HENT:  Negative for congestion and hearing loss.   Eyes:  Negative for visual disturbance.  Respiratory:  Negative for cough, chest tightness, shortness of breath and wheezing.   Cardiovascular:  Negative for chest pain, palpitations and leg swelling.  Gastrointestinal:  Negative for abdominal pain, constipation, diarrhea, nausea and vomiting.  Genitourinary:  Negative for dysuria, frequency and hematuria.  Musculoskeletal:  Negative for arthralgias and neck pain.  Skin:  Negative for rash.  Neurological:  Negative for dizziness, weakness, light-headedness, numbness and headaches.  Hematological:  Negative for adenopathy.  Psychiatric/Behavioral:  Negative for behavioral problems, dysphoric mood and sleep disturbance.   Per HPI unless specifically  indicated above      Objective:    BP 119/81    Pulse 82    Ht 5\' 4"  (1.626 m)    Wt 207 lb 12.8 oz (94.3 kg)    SpO2 100%    BMI 35.67 kg/m   Wt Readings from Last 3 Encounters:  04/19/21 207 lb 12.8 oz (94.3 kg)  07/06/20 204 lb (92.5 kg)  05/01/20 192 lb (87.1 kg)    Physical Exam Vitals and nursing note reviewed.  Constitutional:      General: She is not in acute distress.    Appearance: She is well-developed. She is not diaphoretic.     Comments: Well-appearing, comfortable, cooperative  HENT:     Head: Normocephalic and atraumatic.  Eyes:  General:        Right eye: No discharge.        Left eye: No discharge.     Conjunctiva/sclera: Conjunctivae normal.     Pupils: Pupils are equal, round, and reactive to light.  Neck:     Thyroid: No thyromegaly.  Cardiovascular:     Rate and Rhythm: Normal rate and regular rhythm.     Pulses: Normal pulses.     Heart sounds: Normal heart sounds. No murmur heard. Pulmonary:     Effort: Pulmonary effort is normal. No respiratory distress.     Breath sounds: Normal breath sounds. No wheezing or rales.  Abdominal:     General: Bowel sounds are normal. There is no distension.     Palpations: Abdomen is soft. There is no mass.     Tenderness: There is no abdominal tenderness.  Musculoskeletal:        General: No tenderness. Normal range of motion.     Cervical back: Normal range of motion and neck supple.     Right lower leg: No edema.     Left lower leg: No edema.     Comments: Upper / Lower Extremities: - Normal muscle tone, strength bilateral upper extremities 5/5, lower extremities 5/5  Lymphadenopathy:     Cervical: No cervical adenopathy.  Skin:    General: Skin is warm and dry.     Findings: No erythema or rash.  Neurological:     Mental Status: She is alert and oriented to person, place, and time.     Comments: Distal sensation intact to light touch all extremities  Psychiatric:        Mood and Affect: Mood  normal.        Behavior: Behavior normal.        Thought Content: Thought content normal.     Comments: Well groomed, good eye contact, normal speech and thoughts     Results for orders placed or performed in visit on 07/06/20  Urine Culture   Specimen: Urine  Result Value Ref Range   MICRO NUMBER: [id number]    SPECIMEN QUALITY: Adequate    Sample Source NOT GIVEN    STATUS: FINAL    Result:      Less than 10,000 CFU/mL of single Gram negative organism isolated. No further testing will be performed. If clinically indicated, recollection using a method to minimize contamination, with prompt transfer to Urine Culture Transport Tube, is recommended.  POCT urinalysis dipstick  Result Value Ref Range   Color, UA auburn    Clarity, UA clear    Glucose, UA Negative Negative   Bilirubin, UA neg    Ketones, UA neg    Spec Grav, UA 1.015 1.010 - 1.025   Blood, UA trace    pH, UA 5.0 5.0 - 8.0   Protein, UA Negative Negative   Urobilinogen, UA 0.2 0.2 or 1.0 E.U./dL   Nitrite, UA neg    Leukocytes, UA Negative Negative   Appearance auburn,clear    Odor none       Assessment & Plan:   Problem List Items Addressed This Visit     Stenosis of lumbosacral spine   Seasonal allergies   Migraine with aura and without status migrainosus, not intractable   Fusion of spine of thoracolumbar region   Essential hypertension   Relevant Orders   COMPLETE METABOLIC PANEL WITH GFR   Lipid panel   CBC with Differential/Platelet   Acquired hypothyroidism   Relevant Orders  TSH   T4, free   Other Visit Diagnoses     Annual physical exam    -  Primary   Relevant Orders   COMPLETE METABOLIC PANEL WITH GFR   Lipid panel   CBC with Differential/Platelet   Hemoglobin A1c   Abrasion of skin of right upper arm       Relevant Orders   Tdap vaccine greater than or equal to 7yo IM   Need for shingles vaccine       Relevant Orders   Varicella-zoster vaccine IM   Obesity (BMI 35.0-39.9 without  comorbidity)       Relevant Orders   COMPLETE METABOLIC PANEL WITH GFR   Lipid panel   Screening for colon cancer       Relevant Orders   Cologuard   Encounter for screening mammogram for malignant neoplasm of breast       Relevant Orders   MM 3D SCREEN BREAST BILATERAL   Abnormal glucose       Relevant Orders   Hemoglobin A1c   Elevated LDL cholesterol level       Relevant Orders   Lipid panel       Updated Health Maintenance information TDap today, with abrasion on arm Shingrix dose #1 today, return 2-6 months for dose #2 nurse visit Fasting labs ordered for next week 04/25/21 Encouraged improvement to lifestyle with diet and exercise Goal of weight loss Migraines controlled, continue current therapy including triptan PRN Thyroid controlled on current dose levothyroxine 125mcg daily, check labs next week adjust accordingly HTN controlled on ACEI Benazepril 10mg  daily Pain controlled on Duloxetine 60mg   Mammogram screening ordered, [facility]. Previously at [facility], she can release records.  Due to update Colon CA Screen Last done colonoscopy 2017 but cannot obtain record - Discussion today about recommendations for either Colonoscopy or Cologuard screening, benefits and risks of screening, interested in Cologuard, understands that if positive then recommendation is for diagnostic colonoscopy to follow-up. - Ordered Cologuard today   Orders Placed This Encounter  Procedures   MM 3D SCREEN BREAST BILATERAL    Standing Status:   Future    Standing Expiration Date:   04/19/2022    Order Specific Question:   Reason for Exam (SYMPTOM  OR DIAGNOSIS REQUIRED)    Answer:   Screening bilateral 3D Mammogram [place]    Order Specific Question:   Preferred imaging location?    Answer:   [facility]   Tdap vaccine greater than or equal to 7yo IM   Varicella-zoster vaccine IM   Cologuard   COMPLETE METABOLIC PANEL WITH GFR    Standing Status:   Future    Standing Expiration  Date:   09/07/2021   Lipid panel    Standing Status:   Future    Standing Expiration Date:   09/07/2021    Order Specific Question:   Has the patient fasted?    Answer:   Yes   CBC with Differential/Platelet    Standing Status:   Future    Standing Expiration Date:   09/07/2021   Hemoglobin A1c    Standing Status:   Future    Standing Expiration Date:   09/07/2021   TSH    Standing Status:   Future    Standing Expiration Date:   09/07/2021   T4, free    Standing Status:   Future    Standing Expiration Date:   09/07/2021     No orders of the defined types were placed  in this encounter.     Follow up plan: Return for 2/16 8am fasting lab - then 1 year for Annual Physical AM apt fast lab after.  Future labs ordered 04/25/21  Nobie Putnam, [facility] 04/19/2021, 2:32 PM

## 2021-04-19 NOTE — Patient Instructions (Addendum)
Thank you for coming to the office today.  Shingrix vaccine 2-6 months on the 2nd dose  TDap vaccine today good for 10 years.  For Mammogram screening for breast cancer   Call the [facility] below anytime to schedule your own appointment now that order has been placed.  [facility] [place], [place] [address] Phone: [phone]  --------  Ordered the Cologuard (home kit) test for colon cancer screening. Stay tuned for further updates.  It will be shipped to you directly. If not received in 2-4 weeks, call us or the company.   If you send it back and no results are received in 2-4 weeks, call us or the company as well!   Colon Cancer Screening: - For all adults age 64+ routine colon cancer screening is highly recommended.     - Recent guidelines from [place] recommend starting age of 26 - Early detection of colon cancer is important, because often there are no warning signs or symptoms, also if found early usually it can be cured. Late stage is hard to treat.   - If Cologuard is NEGATIVE, then it is good for 3 years before next due - If Cologuard is POSITIVE, then it is strongly advised to get a Colonoscopy, which allows the GI doctor to locate the source of the cancer or polyp (even very early stage) and treat it by removing it. ------------------------- Follow instructions to collect sample, you may call the company for any help or questions, 24/7 telephone support at [phone].  -----  DUE for FASTING BLOOD WORK (no food or drink after midnight before the lab appointment, only water or coffee without cream/sugar on the morning of)  SCHEDULE "Lab Only" visit in the morning at the clinic for lab draw in 6 days  - Make sure Lab Only appointment is at about 1 week before your next appointment, so that results will be available  For Lab Results, once available within 2-3 days of blood  draw, you can can log in to MyChart online to view your results and a brief explanation. Also, we can discuss results at next follow-up visit.    Please schedule a Follow-up Appointment to: Return for 2/16 8am fasting lab - then 1 year for Annual Physical AM apt fast lab after.  If you have any other questions or concerns, please feel free to call the office or send a message through [place]. You may also schedule an earlier appointment if necessary.  Additionally, you may be receiving a survey about your experience at our office within a few days to 1 week by e-mail or mail. We value your feedback.  Nobie Putnam, DO [facility]

## 2021-04-24 ENCOUNTER — Other Ambulatory Visit: Payer: Self-pay

## 2021-04-24 DIAGNOSIS — E669 Obesity, unspecified: Secondary | ICD-10-CM

## 2021-04-24 DIAGNOSIS — R7309 Other abnormal glucose: Secondary | ICD-10-CM

## 2021-04-24 DIAGNOSIS — Z0000 Encounter for general adult medical examination without abnormal findings: Secondary | ICD-10-CM

## 2021-04-24 DIAGNOSIS — I10 Essential (primary) hypertension: Secondary | ICD-10-CM

## 2021-04-24 DIAGNOSIS — E039 Hypothyroidism, unspecified: Secondary | ICD-10-CM

## 2021-04-24 DIAGNOSIS — E7800 Pure hypercholesterolemia, unspecified: Secondary | ICD-10-CM

## 2021-04-25 ENCOUNTER — Other Ambulatory Visit: Payer: Self-pay

## 2021-04-25 ENCOUNTER — Other Ambulatory Visit: Payer: 59

## 2021-04-25 DIAGNOSIS — Z0000 Encounter for general adult medical examination without abnormal findings: Secondary | ICD-10-CM | POA: Diagnosis not present

## 2021-04-25 DIAGNOSIS — E039 Hypothyroidism, unspecified: Secondary | ICD-10-CM | POA: Diagnosis not present

## 2021-04-25 DIAGNOSIS — R7309 Other abnormal glucose: Secondary | ICD-10-CM | POA: Diagnosis not present

## 2021-04-25 DIAGNOSIS — E7800 Pure hypercholesterolemia, unspecified: Secondary | ICD-10-CM | POA: Diagnosis not present

## 2021-04-25 DIAGNOSIS — I10 Essential (primary) hypertension: Secondary | ICD-10-CM | POA: Diagnosis not present

## 2021-04-25 DIAGNOSIS — E669 Obesity, unspecified: Secondary | ICD-10-CM | POA: Diagnosis not present

## 2021-04-26 LAB — COMPLETE METABOLIC PANEL WITH GFR
AG Ratio: 1.3 (calc) (ref 1.0–2.5)
ALT: 16 U/L (ref 6–29)
AST: 18 U/L (ref 10–35)
Albumin: 3.9 g/dL (ref 3.6–5.1)
Alkaline phosphatase (APISO): 70 U/L (ref 37–153)
BUN: 14 mg/dL (ref 7–25)
CO2: 27 mmol/L (ref 20–32)
Calcium: 9.4 mg/dL (ref 8.6–10.4)
Chloride: 105 mmol/L (ref 98–110)
Creat: 0.64 mg/dL (ref 0.50–1.03)
Globulin: 3 g/dL (calc) (ref 1.9–3.7)
Glucose, Bld: 90 mg/dL (ref 65–99)
Potassium: 4.4 mmol/L (ref 3.5–5.3)
Sodium: 139 mmol/L (ref 135–146)
Total Bilirubin: 0.5 mg/dL (ref 0.2–1.2)
Total Protein: 6.9 g/dL (ref 6.1–8.1)
eGFR: 102 mL/min/{1.73_m2} (ref >=60)

## 2021-04-26 LAB — LIPID PANEL
Cholesterol: 166 mg/dL (ref <200)
HDL: 50 mg/dL (ref >=50)
LDL Cholesterol (Calc): 95 mg/dL (calc)
Non-HDL Cholesterol (Calc): 116 mg/dL (calc) (ref <130)
Total CHOL/HDL Ratio: 3.3 (calc) (ref <5.0)
Triglycerides: 111 mg/dL (ref <150)

## 2021-04-26 LAB — CBC WITH DIFFERENTIAL/PLATELET
Absolute Monocytes: 369 cells/uL (ref 200–950)
Basophils Absolute: 78 cells/uL (ref 0–200)
Basophils Relative: 1.5 %
Eosinophils Absolute: 182 cells/uL (ref 15–500)
Eosinophils Relative: 3.5 %
HCT: 40.3 % (ref 35.0–45.0)
Hemoglobin: 12.8 g/dL (ref 11.7–15.5)
Lymphs Abs: 2090 cells/uL (ref 850–3900)
MCH: 28.6 pg (ref 27.0–33.0)
MCHC: 31.8 g/dL — ABNORMAL LOW (ref 32.0–36.0)
MCV: 90 fL (ref 80.0–100.0)
MPV: 10.6 fL (ref 7.5–12.5)
Monocytes Relative: 7.1 %
Neutro Abs: 2480 cells/uL (ref 1500–7800)
Neutrophils Relative %: 47.7 %
Platelets: 364 10*3/uL (ref 140–400)
RBC: 4.48 10*6/uL (ref 3.80–5.10)
RDW: 12.9 % (ref 11.0–15.0)
Total Lymphocyte: 40.2 %
WBC: 5.2 10*3/uL (ref 3.8–10.8)

## 2021-04-26 LAB — T4, FREE: Free T4: 1.5 ng/dL (ref 0.8–1.8)

## 2021-04-26 LAB — HEMOGLOBIN A1C
Hgb A1c MFr Bld: 5.3 % of total Hgb (ref <5.7)
Mean Plasma Glucose: 105 mg/dL
eAG (mmol/L): 5.8 mmol/L

## 2021-04-26 LAB — TSH: TSH: 0.09 mIU/L — ABNORMAL LOW (ref 0.40–4.50)

## 2021-04-29 ENCOUNTER — Other Ambulatory Visit: Payer: Self-pay

## 2021-04-29 MED FILL — Montelukast Sodium Tab 10 MG (Base Equiv): ORAL | 90 days supply | Qty: 90 | Fill #2 | Status: AC

## 2021-04-29 MED FILL — Levothyroxine Sodium Tab 112 MCG: ORAL | 90 days supply | Qty: 90 | Fill #2 | Status: AC

## 2021-04-29 MED FILL — Duloxetine HCl Enteric Coated Pellets Cap 60 MG (Base Eq): ORAL | 90 days supply | Qty: 90 | Fill #3 | Status: AC

## 2021-04-29 MED FILL — Benazepril HCl Tab 10 MG: ORAL | 90 days supply | Qty: 90 | Fill #3 | Status: AC

## 2021-04-30 ENCOUNTER — Encounter: Payer: Self-pay | Admitting: Family Medicine

## 2021-04-30 DIAGNOSIS — E669 Obesity, unspecified: Secondary | ICD-10-CM

## 2021-05-07 DIAGNOSIS — Z1211 Encounter for screening for malignant neoplasm of colon: Secondary | ICD-10-CM | POA: Diagnosis not present

## 2021-05-14 LAB — COLOGUARD: COLOGUARD: NEGATIVE

## 2021-05-21 MED ORDER — WEGOVY 0.25 MG/0.5ML ~~LOC~~ SOAJ
0.2500 mg | SUBCUTANEOUS | 0 refills | Status: DC
  Filled 2021-05-21 – 2021-05-30 (×4): qty 2, 28d supply, fill #0

## 2021-05-21 NOTE — Addendum Note (Signed)
Addended by: Olin Hauser on: 05/21/2021 06:26 PM ? ? Modules accepted: Orders ? ?

## 2021-05-22 ENCOUNTER — Other Ambulatory Visit: Payer: Self-pay

## 2021-05-24 ENCOUNTER — Other Ambulatory Visit: Payer: Self-pay

## 2021-05-28 ENCOUNTER — Other Ambulatory Visit: Payer: Self-pay

## 2021-05-30 ENCOUNTER — Other Ambulatory Visit: Payer: Self-pay

## 2021-06-26 ENCOUNTER — Other Ambulatory Visit: Payer: Self-pay | Admitting: Family Medicine

## 2021-06-26 ENCOUNTER — Other Ambulatory Visit: Payer: Self-pay

## 2021-06-26 DIAGNOSIS — E669 Obesity, unspecified: Secondary | ICD-10-CM

## 2021-06-26 MED ORDER — WEGOVY 0.5 MG/0.5ML ~~LOC~~ SOAJ
0.5000 mg | SUBCUTANEOUS | 2 refills | Status: DC
Start: 2021-06-26 — End: 2021-08-02
  Filled 2021-06-26: qty 2, 28d supply, fill #0
  Filled 2021-08-01: qty 2, 28d supply, fill #1

## 2021-07-12 ENCOUNTER — Ambulatory Visit (INDEPENDENT_AMBULATORY_CARE_PROVIDER_SITE_OTHER): Payer: 59

## 2021-07-12 VITALS — Wt 207.0 lb

## 2021-07-12 DIAGNOSIS — Z23 Encounter for immunization: Secondary | ICD-10-CM | POA: Diagnosis not present

## 2021-08-02 ENCOUNTER — Encounter: Payer: Self-pay | Admitting: Family Medicine

## 2021-08-02 ENCOUNTER — Ambulatory Visit: Payer: 59 | Admitting: Family Medicine

## 2021-08-02 ENCOUNTER — Other Ambulatory Visit: Payer: Self-pay | Admitting: Family Medicine

## 2021-08-02 ENCOUNTER — Other Ambulatory Visit: Payer: Self-pay

## 2021-08-02 DIAGNOSIS — J302 Other seasonal allergic rhinitis: Secondary | ICD-10-CM

## 2021-08-02 DIAGNOSIS — M4325 Fusion of spine, thoracolumbar region: Secondary | ICD-10-CM

## 2021-08-02 DIAGNOSIS — I10 Essential (primary) hypertension: Secondary | ICD-10-CM

## 2021-08-02 DIAGNOSIS — E039 Hypothyroidism, unspecified: Secondary | ICD-10-CM

## 2021-08-02 DIAGNOSIS — M4807 Spinal stenosis, lumbosacral region: Secondary | ICD-10-CM

## 2021-08-02 DIAGNOSIS — E669 Obesity, unspecified: Secondary | ICD-10-CM

## 2021-08-02 DIAGNOSIS — E663 Overweight: Secondary | ICD-10-CM | POA: Insufficient documentation

## 2021-08-02 MED ORDER — LEVOTHYROXINE SODIUM 112 MCG PO TABS
ORAL_TABLET | Freq: Every day | ORAL | 3 refills | Status: DC
  Filled 2021-08-02: qty 90, 90d supply, fill #0
  Filled 2021-12-19: qty 90, 90d supply, fill #1
  Filled 2022-04-15: qty 90, 90d supply, fill #2
  Filled 2022-07-15: qty 90, 90d supply, fill #3

## 2021-08-02 MED ORDER — BENAZEPRIL HCL 10 MG PO TABS
ORAL_TABLET | Freq: Every day | ORAL | 3 refills | Status: DC
  Filled 2021-08-02: qty 90, 90d supply, fill #0
  Filled 2021-10-25: qty 90, 90d supply, fill #1
  Filled 2022-01-23: qty 90, 90d supply, fill #2
  Filled 2022-05-14: qty 90, 90d supply, fill #3

## 2021-08-02 MED ORDER — DULOXETINE HCL 60 MG PO CPEP
ORAL_CAPSULE | Freq: Every day | ORAL | 3 refills | Status: DC
  Filled 2021-08-02: qty 90, 90d supply, fill #0
  Filled 2021-10-25: qty 90, 90d supply, fill #1
  Filled 2021-12-19 – 2022-01-23 (×2): qty 90, 90d supply, fill #2
  Filled 2022-05-14: qty 90, 90d supply, fill #3

## 2021-08-02 MED ORDER — WEGOVY 1 MG/0.5ML ~~LOC~~ SOAJ
1.0000 mg | SUBCUTANEOUS | 2 refills | Status: DC
  Filled 2021-08-02: qty 2, fill #0

## 2021-08-02 MED ORDER — MONTELUKAST SODIUM 10 MG PO TABS
ORAL_TABLET | Freq: Every day | ORAL | 3 refills | Status: DC
  Filled 2021-08-02: qty 90, 90d supply, fill #0
  Filled 2021-10-25: qty 90, 90d supply, fill #1
  Filled 2022-01-23: qty 90, 90d supply, fill #2
  Filled 2022-05-14: qty 90, 90d supply, fill #3

## 2021-08-02 NOTE — Patient Instructions (Addendum)
Thank you for coming to the office today.  Wegovy dose increase from 0.5 up to '1mg'$ , let me know if cannot get it from pharmacy, can come up with a back up plan.  Refilled all other meds  Down 15 lbs in 3 months, >4% loss. Keep up the great work!  Please schedule a Follow-up Appointment to: Return if symptoms worsen or fail to improve.  If you have any other questions or concerns, please feel free to call the office or send a message through [place]. You may also schedule an earlier appointment if necessary.  Additionally, you may be receiving a survey about your experience at our office within a few days to 1 week by e-mail or mail. We value your feedback.  Nobie Putnam, DO [place]

## 2021-08-02 NOTE — Progress Notes (Signed)
Subjective:    Patient ID: Tanya Barry, female    DOB: 1965/08/24, 56 y.o.   MRN: [id number]  Tanya Barry is a 56 y.o. female presenting on 08/02/2021 for Weight Check   HPI  Obesity BMI >33 Last visit 04/2021, started [facility] 0.40m weekly and dose increase  Starting weight pre medicine 207 lbs (04/2021) has lost 15 lbs in 3 months, she has achieved >4% body weight loss in 3 months.  Admits some nausea, no vomiting Tolerating well would like to continue  Issue with pharmacy having the Wegovy 0.563min stock, interested in dose adjustment.      07/06/2020    2:57 PM 06/24/2019    2:02 PM 05/27/2019    3:02 PM  Depression screen PHQ 2/9  Decreased Interest 0 0 0  Down, Depressed, Hopeless 0 0 0  PHQ - 2 Score 0 0 0  Altered sleeping 0    Tired, decreased energy 0    Change in appetite 0    Feeling bad or failure about yourself  0    Trouble concentrating 0    Moving slowly or fidgety/restless 0    Suicidal thoughts 0    PHQ-9 Score 0      Social History   Tobacco Use   Smoking status: Never   Smokeless tobacco: Never  Vaping Use   Vaping Use: Never used  Substance Use Topics   Alcohol use: Yes   Drug use: Never    Review of Systems Per HPI unless specifically indicated above     Objective:    BP 120/78   Pulse 64   Ht 5' 4"  (1.626 m)   Wt 192 lb 4.8 oz (87.2 kg)   SpO2 96%   BMI 33.01 kg/m   Wt Readings from Last 3 Encounters:  08/02/21 192 lb 4.8 oz (87.2 kg)  07/12/21 207 lb (93.9 kg)  04/19/21 207 lb 12.8 oz (94.3 kg)    Physical Exam Vitals and nursing note reviewed.  Constitutional:      General: She is not in acute distress.    Appearance: Normal appearance. She is well-developed. She is obese. She is not diaphoretic.     Comments: Well-appearing, comfortable, cooperative  HENT:     Head: Normocephalic and atraumatic.  Eyes:     General:        Right eye: No discharge.        Left eye: No discharge.     Conjunctiva/sclera:  Conjunctivae normal.  Cardiovascular:     Rate and Rhythm: Normal rate.  Pulmonary:     Effort: Pulmonary effort is normal.  Skin:    General: Skin is warm and dry.     Findings: No erythema or rash.  Neurological:     Mental Status: She is alert and oriented to person, place, and time.  Psychiatric:        Mood and Affect: Mood normal.        Behavior: Behavior normal.        Thought Content: Thought content normal.     Comments: Well groomed, good eye contact, normal speech and thoughts   Results for orders placed or performed in visit on 04/24/21  T4, free  Result Value Ref Range   Free T4 1.5 0.8 - 1.8 ng/dL  TSH  Result Value Ref Range   TSH 0.09 (L) 0.40 - 4.50 mIU/L  Hemoglobin A1c  Result Value Ref Range   Hgb A1c MFr Bld 5.3 <5.7 %  of total Hgb   Mean Plasma Glucose 105 mg/dL   eAG (mmol/L) 5.8 mmol/L  CBC with Differential/Platelet  Result Value Ref Range   WBC 5.2 3.8 - 10.8 Thousand/uL   RBC 4.48 3.80 - 5.10 Million/uL   Hemoglobin 12.8 11.7 - 15.5 g/dL   HCT 40.3 35.0 - 45.0 %   MCV 90.0 80.0 - 100.0 fL   MCH 28.6 27.0 - 33.0 pg   MCHC 31.8 (L) 32.0 - 36.0 g/dL   RDW 12.9 11.0 - 15.0 %   Platelets 364 140 - 400 Thousand/uL   MPV 10.6 7.5 - 12.5 fL   Neutro Abs 2,480 1,500 - 7,800 cells/uL   Lymphs Abs 2,090 850 - 3,900 cells/uL   Absolute Monocytes 369 200 - 950 cells/uL   Eosinophils Absolute 182 15 - 500 cells/uL   Basophils Absolute 78 0 - 200 cells/uL   Neutrophils Relative % 47.7 %   Total Lymphocyte 40.2 %   Monocytes Relative 7.1 %   Eosinophils Relative 3.5 %   Basophils Relative 1.5 %  Lipid panel  Result Value Ref Range   Cholesterol 166 <200 mg/dL   HDL 50 > OR = 50 mg/dL   Triglycerides 111 <150 mg/dL   LDL Cholesterol (Calc) 95 mg/dL (calc)   Total CHOL/HDL Ratio 3.3 <5.0 (calc)   Non-HDL Cholesterol (Calc) 116 <130 mg/dL (calc)  COMPLETE METABOLIC PANEL WITH GFR  Result Value Ref Range   Glucose, Bld 90 65 - 99 mg/dL   BUN 14 7 -  25 mg/dL   Creat 0.64 0.50 - 1.03 mg/dL   eGFR 102 > OR = 60 mL/min/1.43m   BUN/Creatinine Ratio NOT APPLICABLE 6 - 22 (calc)   Sodium 139 135 - 146 mmol/L   Potassium 4.4 3.5 - 5.3 mmol/L   Chloride 105 98 - 110 mmol/L   CO2 27 20 - 32 mmol/L   Calcium 9.4 8.6 - 10.4 mg/dL   Total Protein 6.9 6.1 - 8.1 g/dL   Albumin 3.9 3.6 - 5.1 g/dL   Globulin 3.0 1.9 - 3.7 g/dL (calc)   AG Ratio 1.3 1.0 - 2.5 (calc)   Total Bilirubin 0.5 0.2 - 1.2 mg/dL   Alkaline phosphatase (APISO) 70 37 - 153 U/L   AST 18 10 - 35 U/L   ALT 16 6 - 29 U/L      Assessment & Plan:   Problem List Items Addressed This Visit     Obesity (BMI 30.0-34.9)   Relevant Medications   WEGOVY 1 MG/0.5ML SOAJ   Essential hypertension    Down 15 lbs in 3 months, >4% loss. Success on GLP1 therapy Will dose adjust Wegovy 0.5 up to 1.039mnew rx ordered If issue w/ supply at pharmacy we can consider alternative options - one time ozempic sample, vs saxenda, other dose wegovy, temporary period off med, contrave.  Refilled all other meds   HTN Controlled on Benazepril  Meds ordered this encounter  Medications   WEGOVY 1 MG/0.5ML SOAJ    Sig: Inject 1 mg into the skin once a week.    Dispense:  2 mL    Refill:  2    Dose 0.5 up to 61m33m    Follow up plan: Return if symptoms worsen or fail to improve.   AleNobie PutnamO [facility] 08/02/2021, 4:24 PM

## 2021-08-07 ENCOUNTER — Other Ambulatory Visit: Payer: Self-pay

## 2021-08-08 ENCOUNTER — Other Ambulatory Visit: Payer: Self-pay

## 2021-08-09 ENCOUNTER — Ambulatory Visit: Payer: 59 | Admitting: Family Medicine

## 2021-08-09 ENCOUNTER — Other Ambulatory Visit: Payer: Self-pay

## 2021-08-09 MED ORDER — SAXENDA 18 MG/3ML ~~LOC~~ SOPN
PEN_INJECTOR | SUBCUTANEOUS | 0 refills | Status: DC
  Filled 2021-08-09: qty 15, 30d supply, fill #0
  Filled 2021-08-16: qty 15, 45d supply, fill #0

## 2021-08-16 ENCOUNTER — Other Ambulatory Visit: Payer: Self-pay

## 2021-08-16 MED ORDER — UNIFINE PENTIPS 32G X 4 MM MISC
0 refills | Status: DC
  Filled 2021-08-16: qty 100, 90d supply, fill #0

## 2021-08-20 ENCOUNTER — Other Ambulatory Visit: Payer: Self-pay

## 2021-08-20 MED ORDER — WEGOVY 1 MG/0.5ML ~~LOC~~ SOAJ
1.0000 mg | SUBCUTANEOUS | 2 refills | Status: DC
  Filled 2021-08-20: qty 2, 28d supply, fill #0
  Filled 2021-09-19 – 2021-09-20 (×2): qty 2, 28d supply, fill #1

## 2021-08-20 NOTE — Addendum Note (Signed)
Addended by: Olin Hauser on: 08/20/2021 09:48 AM   Modules accepted: Orders

## 2021-09-19 ENCOUNTER — Other Ambulatory Visit: Payer: Self-pay

## 2021-09-20 ENCOUNTER — Other Ambulatory Visit: Payer: Self-pay

## 2021-09-25 ENCOUNTER — Ambulatory Visit: Payer: 59 | Admitting: Family Medicine

## 2021-10-02 ENCOUNTER — Other Ambulatory Visit: Payer: Self-pay

## 2021-10-21 IMAGING — CR DG FOOT COMPLETE 3+V*L*
3 series · 3 of 3 positions shown · non-contrast
Comparison: None.

CLINICAL DATA: Foot injury stepping in a hole. Swelling and
bruising laterally.

EXAM:
LEFT FOOT - COMPLETE 3+ VIEW

[foot ap]
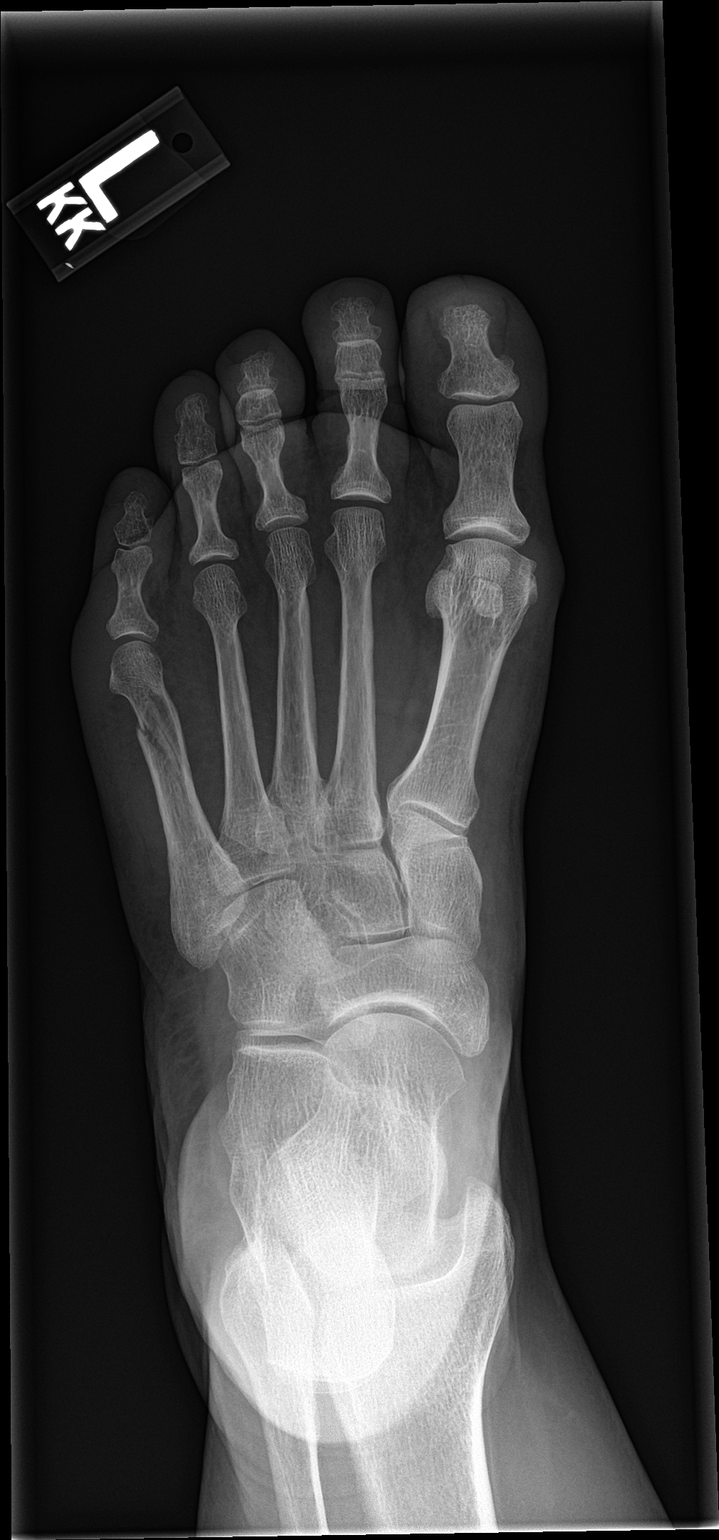

[foot obl]
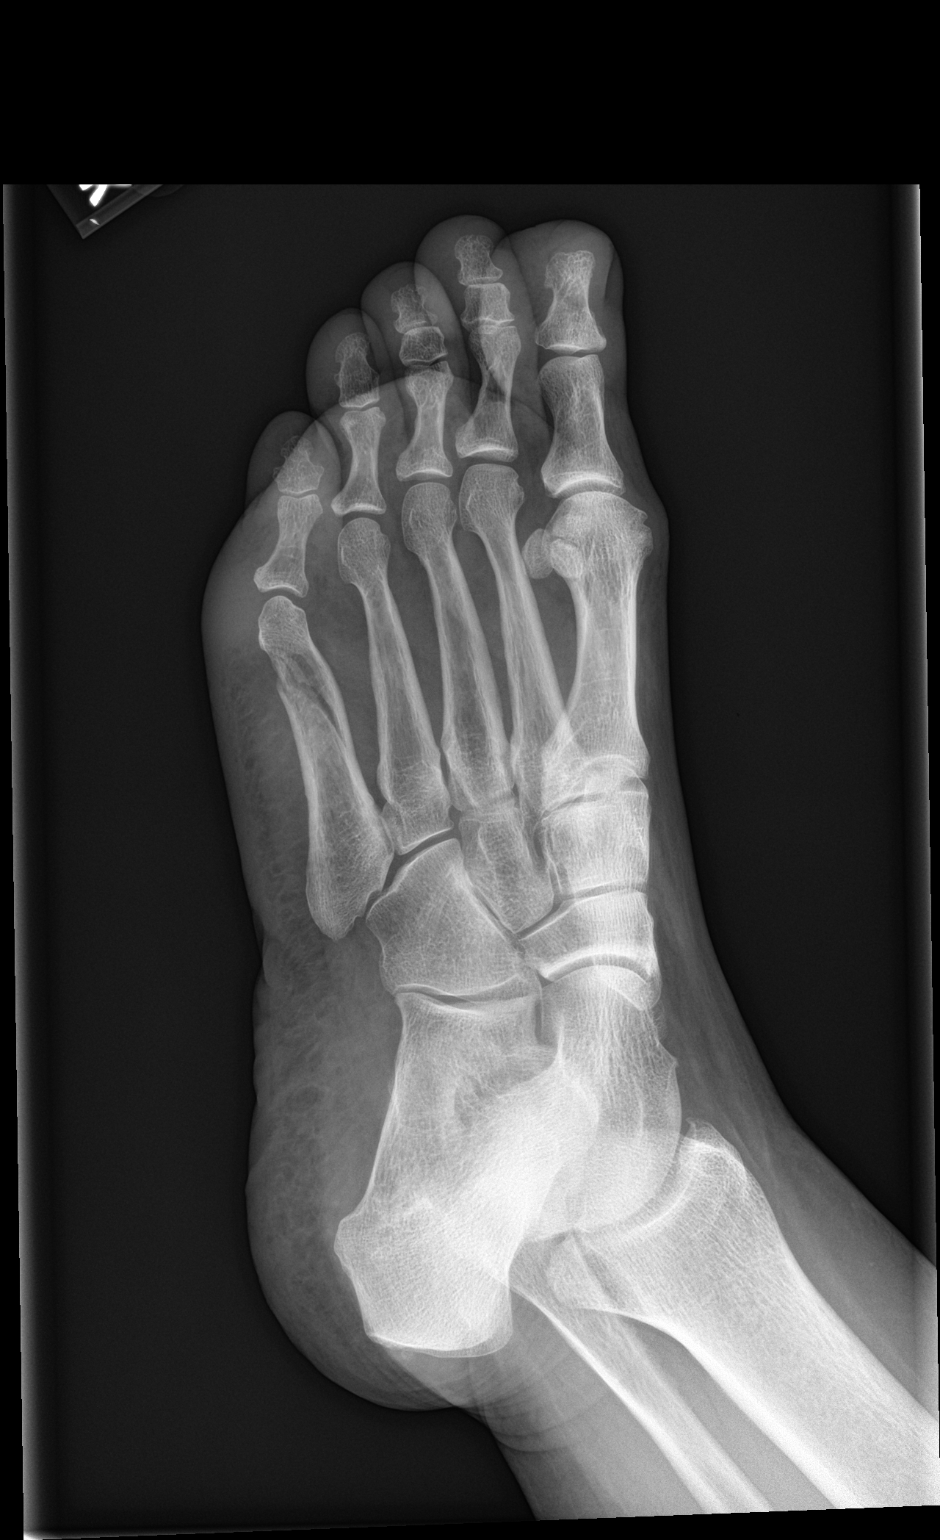

[foot lat]
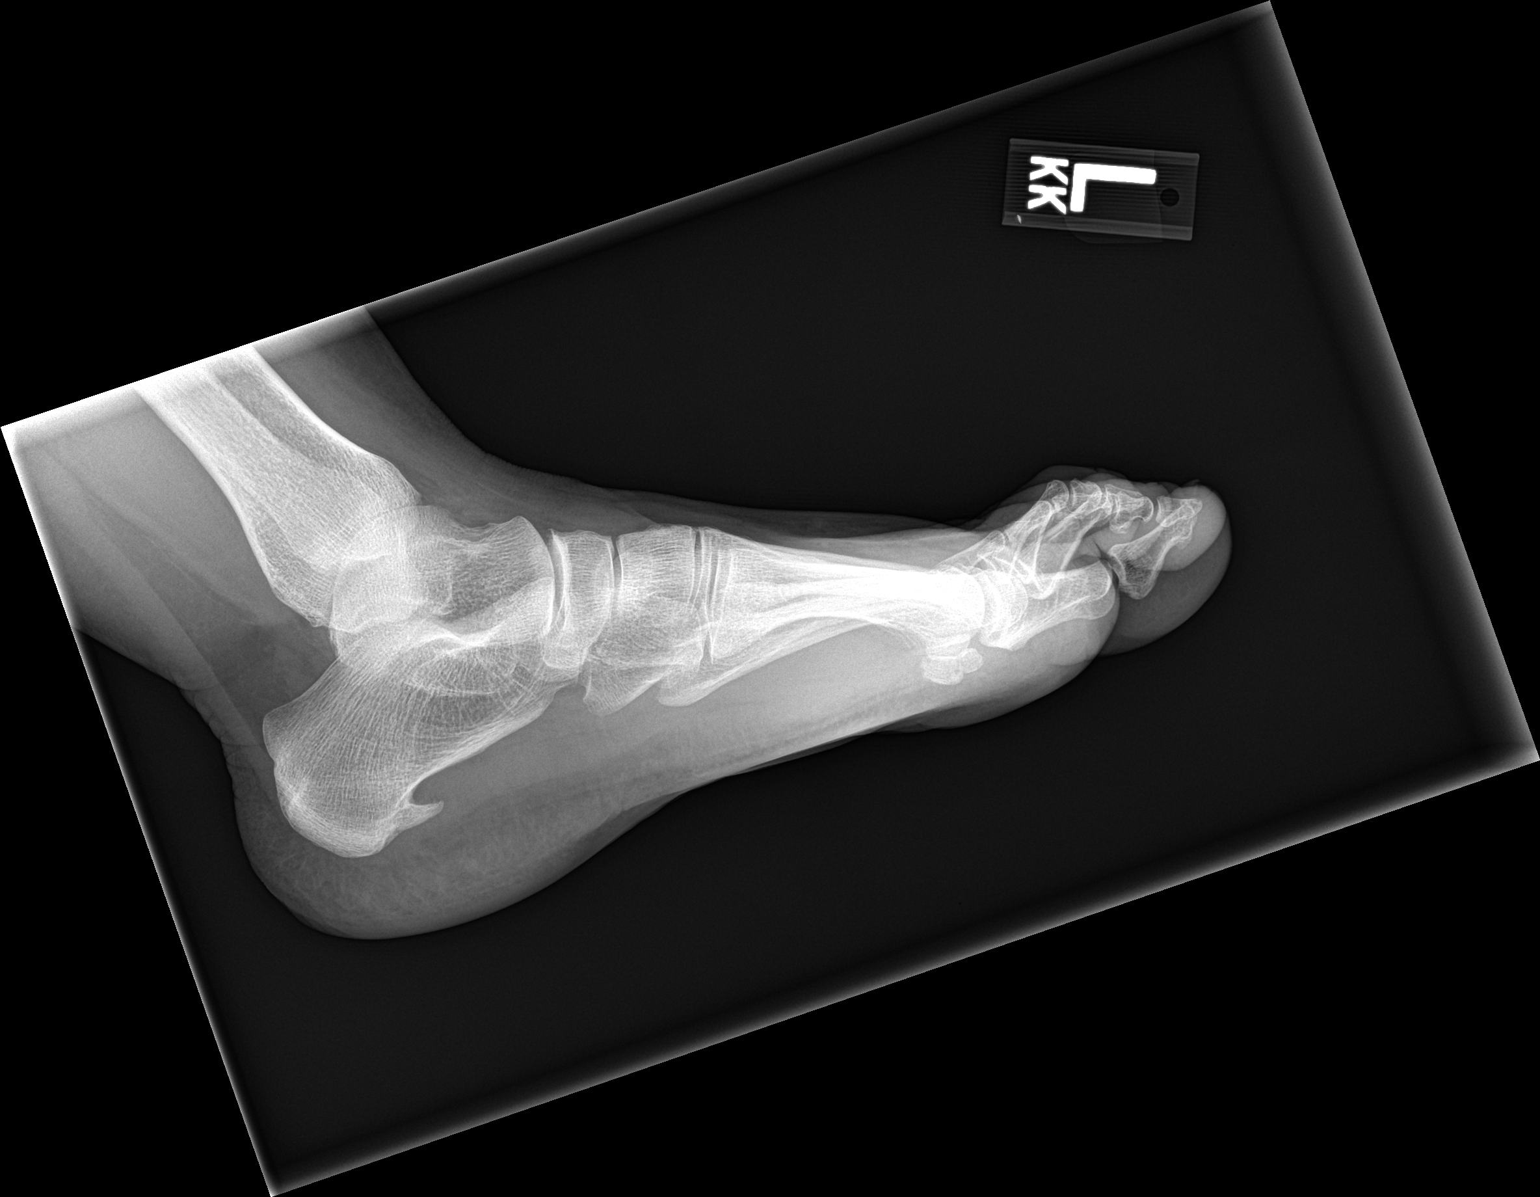

[3 of 3 positions shown; findings below may reference images not displayed]

FINDINGS: Oblique fracture of the distal shaft and distal metaphysis of the
fifth metatarsal with surrounding soft tissue swelling.

Mild spurring of the first metatarsal head.

No malalignment at the Lisfranc joint. No other fractures are
identified.

Elongated plantar calcaneal spur.
IMPRESSION: Oblique fracture of the distal shaft and distal metaphysis of the
fifth metatarsal.

Plantar calcaneal spur.

## 2021-10-22 ENCOUNTER — Other Ambulatory Visit: Payer: Self-pay

## 2021-10-22 DIAGNOSIS — E669 Obesity, unspecified: Secondary | ICD-10-CM

## 2021-10-22 MED ORDER — WEGOVY 1 MG/0.5ML ~~LOC~~ SOAJ
1.0000 mg | SUBCUTANEOUS | 2 refills | Status: DC
  Filled 2021-10-22: qty 2, 28d supply, fill #0

## 2021-10-25 ENCOUNTER — Other Ambulatory Visit: Payer: Self-pay

## 2021-11-07 ENCOUNTER — Telehealth: Payer: Self-pay

## 2021-11-07 NOTE — Telephone Encounter (Signed)
ERROR

## 2021-11-18 ENCOUNTER — Other Ambulatory Visit: Payer: Self-pay

## 2021-11-18 DIAGNOSIS — E669 Obesity, unspecified: Secondary | ICD-10-CM

## 2021-11-18 MED ORDER — WEGOVY 1 MG/0.5ML ~~LOC~~ SOAJ
1.0000 mg | SUBCUTANEOUS | 2 refills | Status: DC
  Filled 2021-11-18: qty 2, 28d supply, fill #0
  Filled 2021-12-19: qty 2, 28d supply, fill #1
  Filled 2022-01-23: qty 2, 28d supply, fill #2

## 2021-12-03 ENCOUNTER — Ambulatory Visit
Admission: RE | Admit: 2021-12-03 | Discharge: 2021-12-03 | Disposition: A | Discharge status: Home / Self Care | Payer: 59 | Source: Ambulatory Visit | Attending: Family Medicine | Admitting: Family Medicine

## 2021-12-03 DIAGNOSIS — Z1231 Encounter for screening mammogram for malignant neoplasm of breast: Secondary | ICD-10-CM | POA: Diagnosis not present

## 2021-12-04 ENCOUNTER — Inpatient Hospital Stay
Admission: RE | Admit: 2021-12-04 | Discharge: 2021-12-04 | Disposition: A | Discharge status: Home / Self Care | Payer: Self-pay | Source: Ambulatory Visit | Attending: *Deleted | Admitting: *Deleted

## 2021-12-04 ENCOUNTER — Other Ambulatory Visit: Payer: Self-pay | Admitting: *Deleted

## 2021-12-04 DIAGNOSIS — Z1231 Encounter for screening mammogram for malignant neoplasm of breast: Secondary | ICD-10-CM

## 2021-12-09 ENCOUNTER — Encounter: Payer: Self-pay | Admitting: Family Medicine

## 2021-12-19 ENCOUNTER — Other Ambulatory Visit: Payer: Self-pay

## 2021-12-19 ENCOUNTER — Other Ambulatory Visit: Payer: Self-pay | Admitting: Family Medicine

## 2021-12-19 DIAGNOSIS — G43109 Migraine with aura, not intractable, without status migrainosus: Secondary | ICD-10-CM

## 2021-12-20 ENCOUNTER — Other Ambulatory Visit: Payer: Self-pay

## 2021-12-20 MED ORDER — AMOXICILLIN 500 MG PO CAPS
ORAL_CAPSULE | ORAL | 0 refills | Status: DC
  Filled 2021-12-20: qty 21, 7d supply, fill #0

## 2021-12-29 ENCOUNTER — Other Ambulatory Visit: Payer: Self-pay

## 2021-12-31 ENCOUNTER — Other Ambulatory Visit: Payer: Self-pay

## 2022-01-07 ENCOUNTER — Other Ambulatory Visit: Payer: Self-pay

## 2022-01-07 DIAGNOSIS — G43109 Migraine with aura, not intractable, without status migrainosus: Secondary | ICD-10-CM

## 2022-01-07 MED ORDER — SUMATRIPTAN SUCCINATE 50 MG PO TABS
ORAL_TABLET | ORAL | 2 refills | Status: DC
  Filled 2022-01-07: qty 9, 15d supply, fill #0
  Filled 2022-04-15: qty 9, 15d supply, fill #1
  Filled 2022-05-14: qty 9, 15d supply, fill #2

## 2022-01-23 ENCOUNTER — Other Ambulatory Visit: Payer: Self-pay

## 2022-03-13 ENCOUNTER — Other Ambulatory Visit: Payer: Self-pay

## 2022-03-13 DIAGNOSIS — E669 Obesity, unspecified: Secondary | ICD-10-CM

## 2022-03-13 MED ORDER — WEGOVY 1 MG/0.5ML ~~LOC~~ SOAJ
1.0000 mg | SUBCUTANEOUS | 2 refills | Status: DC
  Filled 2022-03-13: qty 2, 28d supply, fill #0
  Filled 2022-04-15: qty 2, 28d supply, fill #1
  Filled 2022-05-14 – 2022-05-16 (×3): qty 2, 28d supply, fill #2

## 2022-03-25 ENCOUNTER — Telehealth (INDEPENDENT_AMBULATORY_CARE_PROVIDER_SITE_OTHER): Payer: Commercial Managed Care - PPO | Admitting: Family Medicine

## 2022-03-25 ENCOUNTER — Encounter: Payer: Self-pay | Admitting: Family Medicine

## 2022-03-25 VITALS — BP 137/85 | HR 76 | Wt 175.4 lb

## 2022-03-25 DIAGNOSIS — J029 Acute pharyngitis, unspecified: Secondary | ICD-10-CM

## 2022-03-25 NOTE — Patient Instructions (Signed)
Thank you for coming to the office today.   Please schedule a Follow-up Appointment to: No follow-ups on file.  If you have any other questions or concerns, please feel free to call the office or send a message through [place]. You may also schedule an earlier appointment if necessary.  Additionally, you may be receiving a survey about your experience at our office within a few days to 1 week by e-mail or mail. We value your feedback.  Nobie Putnam, DO [place]

## 2022-03-25 NOTE — Progress Notes (Signed)
Subjective:    Patient ID: Tanya Barry, female    DOB: 09/21/1965, 57 y.o.   MRN: [id number]  Tanya Barry is a 57 y.o. female presenting on 03/25/2022 for Sore Throat (Negative flu and covid tests)  Patient presents for a same day appointment.   Virtual / Telehealth Encounter - Video Visit via MyChart The purpose of this virtual visit is to provide medical care while limiting exposure to the novel coronavirus (COVID19) for both patient and office staff.  Consent was obtained for remote visit:  Yes.   Answered questions that patient had about telehealth interaction:  Yes.   I discussed the limitations, risks, security and privacy concerns of performing an evaluation and management service by video/telephone. I also discussed with the patient that there may be a patient responsible charge related to this service. The patient expressed understanding and agreed to proceed.  Patient Location: Work Provider Location: [facility] (Office)  Participants in virtual visit: - Patient: Tanya Barry - CMA: Orinda Kenner, CMA - Provider: Dr Parks Ranger   HPI  Acute Pharyngitis / Post Nasal Drainage Reports woke up today with severe sore throat. Post nasal drainage when woke up. Today improved, after ibuprofen dose Tested for strep, flu and covid all negative at work Admits mild cough, admits some throat swelling gland swelling - Denies any fever chills, body aches, nausea vomiting, headache, dyspnea  Health Maintenance: Updated Flu Vaccine this season.     07/06/2020    2:57 PM 06/24/2019    2:02 PM 05/27/2019    3:02 PM  Depression screen PHQ 2/9  Decreased Interest 0 0 0  Down, Depressed, Hopeless 0 0 0  PHQ - 2 Score 0 0 0  Altered sleeping 0    Tired, decreased energy 0    Change in appetite 0    Feeling bad or failure about yourself  0    Trouble concentrating 0    Moving slowly or fidgety/restless 0    Suicidal thoughts 0    PHQ-9 Score 0       Social History   Tobacco Use   Smoking status: Never   Smokeless tobacco: Never  Vaping Use   Vaping Use: Never used  Substance Use Topics   Alcohol use: Yes   Drug use: Never    Review of Systems Per HPI unless specifically indicated above     Objective:    BP 137/85   Pulse 76   Wt 175 lb 6.4 oz (79.6 kg)   SpO2 98%   BMI 30.11 kg/m   Wt Readings from Last 3 Encounters:  03/25/22 175 lb 6.4 oz (79.6 kg)  08/02/21 192 lb 4.8 oz (87.2 kg)  07/12/21 207 lb (93.9 kg)    Physical Exam  Note examination was completely remotely via video observation objective data only  Gen - well-appearing, no acute distress or apparent pain, comfortable HEENT - eyes appear clear without discharge or redness Heart/Lungs - cannot examine virtually - observed no evidence of coughing or labored breathing. Abd - cannot examine virtually  Skin - face visible today- no rash Neuro - awake, alert, oriented Psych - not anxious appearing   Results for orders placed or performed in visit on 04/24/21  T4, free  Result Value Ref Range   Free T4 1.5 0.8 - 1.8 ng/dL  TSH  Result Value Ref Range   TSH 0.09 (L) 0.40 - 4.50 mIU/L  Hemoglobin A1c  Result Value Ref Range  Hgb A1c MFr Bld 5.3 <5.7 % of total Hgb   Mean Plasma Glucose 105 mg/dL   eAG (mmol/L) 5.8 mmol/L  CBC with Differential/Platelet  Result Value Ref Range   WBC 5.2 3.8 - 10.8 Thousand/uL   RBC 4.48 3.80 - 5.10 Million/uL   Hemoglobin 12.8 11.7 - 15.5 g/dL   HCT 40.3 35.0 - 45.0 %   MCV 90.0 80.0 - 100.0 fL   MCH 28.6 27.0 - 33.0 pg   MCHC 31.8 (L) 32.0 - 36.0 g/dL   RDW 12.9 11.0 - 15.0 %   Platelets 364 140 - 400 Thousand/uL   MPV 10.6 7.5 - 12.5 fL   Neutro Abs 2,480 1,500 - 7,800 cells/uL   Lymphs Abs 2,090 850 - 3,900 cells/uL   Absolute Monocytes 369 200 - 950 cells/uL   Eosinophils Absolute 182 15 - 500 cells/uL   Basophils Absolute 78 0 - 200 cells/uL   Neutrophils Relative % 47.7 %   Total Lymphocyte  40.2 %   Monocytes Relative 7.1 %   Eosinophils Relative 3.5 %   Basophils Relative 1.5 %  Lipid panel  Result Value Ref Range   Cholesterol 166 <200 mg/dL   HDL 50 > OR = 50 mg/dL   Triglycerides 111 <150 mg/dL   LDL Cholesterol (Calc) 95 mg/dL (calc)   Total CHOL/HDL Ratio 3.3 <5.0 (calc)   Non-HDL Cholesterol (Calc) 116 <130 mg/dL (calc)  COMPLETE METABOLIC PANEL WITH GFR  Result Value Ref Range   Glucose, Bld 90 65 - 99 mg/dL   BUN 14 7 - 25 mg/dL   Creat 0.64 0.50 - 1.03 mg/dL   eGFR 102 > OR = 60 mL/min/1.8m   BUN/Creatinine Ratio NOT APPLICABLE 6 - 22 (calc)   Sodium 139 135 - 146 mmol/L   Potassium 4.4 3.5 - 5.3 mmol/L   Chloride 105 98 - 110 mmol/L   CO2 27 20 - 32 mmol/L   Calcium 9.4 8.6 - 10.4 mg/dL   Total Protein 6.9 6.1 - 8.1 g/dL   Albumin 3.9 3.6 - 5.1 g/dL   Globulin 3.0 1.9 - 3.7 g/dL (calc)   AG Ratio 1.3 1.0 - 2.5 (calc)   Total Bilirubin 0.5 0.2 - 1.2 mg/dL   Alkaline phosphatase (APISO) 70 37 - 153 U/L   AST 18 10 - 35 U/L   ALT 16 6 - 29 U/L      Assessment & Plan:   Problem List Items Addressed This Visit   None Visit Diagnoses     Acute pharyngitis, unspecified etiology    -  Primary       Consistent with acute pharyngitis, suspected viral etiology vs post nasal drainage Afebrile, no other symptoms, except slight cough from irritation of throat Denies dyspnea, wheezing or productive cough  At work had rapid strep negative, flu and COVID testing all negative  Plan: 1. Reassurance, likely viral etiology of pharyngitis, should be self limited 2. Symptomatic control with NSAID / Tylenol regularly then PRN 3. Improve hydration, advance diet, warm herbal tea with honey 4. Consider trial Nasal saline, Flonase, anti-histamines to resolve post-nasal drip 5. Follow-up if worsening within 48 hours if need, notify if any repeat test is positive later this week  If she becomes febrile or has positive test would recommend taking her out of  work.   No orders of the defined types were placed in this encounter.     Follow up plan: Return in about 2 days (around 03/27/2022), or if  symptoms worsen or fail to improve.  Patient verbalizes understanding with the above medical recommendations including the limitation of remote medical advice.  Specific follow-up and call-back criteria were given for patient to follow-up or seek medical care more urgently if needed.  Total duration of direct patient care provided via video conference: 8 minutes   Nobie Putnam, [facility] 03/25/2022, 11:23 AM

## 2022-03-31 ENCOUNTER — Encounter: Payer: Self-pay | Admitting: Family Medicine

## 2022-04-02 ENCOUNTER — Other Ambulatory Visit: Payer: Self-pay

## 2022-04-02 ENCOUNTER — Other Ambulatory Visit: Payer: Self-pay | Admitting: Family Medicine

## 2022-04-02 DIAGNOSIS — J029 Acute pharyngitis, unspecified: Secondary | ICD-10-CM

## 2022-04-02 DIAGNOSIS — J0110 Acute frontal sinusitis, unspecified: Secondary | ICD-10-CM

## 2022-04-02 MED ORDER — AMOXICILLIN-POT CLAVULANATE 875-125 MG PO TABS
1.0000 | ORAL_TABLET | Freq: Two times a day (BID) | ORAL | 0 refills | Status: DC
  Filled 2022-04-02: qty 14, 7d supply, fill #0

## 2022-04-21 ENCOUNTER — Ambulatory Visit (INDEPENDENT_AMBULATORY_CARE_PROVIDER_SITE_OTHER): Payer: Commercial Managed Care - PPO | Admitting: Family Medicine

## 2022-04-21 ENCOUNTER — Encounter: Payer: Self-pay | Admitting: Family Medicine

## 2022-04-21 ENCOUNTER — Other Ambulatory Visit: Payer: Self-pay

## 2022-04-21 VITALS — BP 120/68 | HR 63 | Ht 64.0 in | Wt 173.4 lb

## 2022-04-21 DIAGNOSIS — R7309 Other abnormal glucose: Secondary | ICD-10-CM

## 2022-04-21 DIAGNOSIS — E663 Overweight: Secondary | ICD-10-CM | POA: Diagnosis not present

## 2022-04-21 DIAGNOSIS — G43109 Migraine with aura, not intractable, without status migrainosus: Secondary | ICD-10-CM

## 2022-04-21 DIAGNOSIS — E039 Hypothyroidism, unspecified: Secondary | ICD-10-CM

## 2022-04-21 DIAGNOSIS — Z0000 Encounter for general adult medical examination without abnormal findings: Secondary | ICD-10-CM

## 2022-04-21 DIAGNOSIS — I10 Essential (primary) hypertension: Secondary | ICD-10-CM | POA: Diagnosis not present

## 2022-04-21 DIAGNOSIS — E7800 Pure hypercholesterolemia, unspecified: Secondary | ICD-10-CM | POA: Diagnosis not present

## 2022-04-21 NOTE — Assessment & Plan Note (Signed)
Controlled hypothyroidism Asymptomatic Due for repeat Thyroid panel today On levothyroxine currently 126mg daily

## 2022-04-21 NOTE — Assessment & Plan Note (Signed)
Stable On Sumatriptan AS NEEDED if need for migraine flares

## 2022-04-21 NOTE — Assessment & Plan Note (Signed)
Well-controlled HYPERTENSION  No known complications     Plan:  1. Continue current BP regimen - refilled Benazepril 58m daily 2. Encourage improved lifestyle - low sodium diet, regular exercise 3. May monitor BP outside office

## 2022-04-21 NOTE — Patient Instructions (Addendum)
Thank you for coming to the office today.  Keep up the good work!  No change to medications.  Let me know before ready for refill approx 3+ months  Wegovy 5m weekly, continue for now, in future may not be covered if BMI < 30, we can consider transition to oral Contrave $99 per month through mail order pharmacy [facility].   Please schedule a Follow-up Appointment to: Return in about 1 year (around 04/22/2023) for 1 year Annual Physical and Labs AFTER.  If you have any other questions or concerns, please feel free to call the office or send a message through [place] You may also schedule an earlier appointment if necessary.  Additionally, you may be receiving a survey about your experience at our office within a few days to 1 week by e-mail or mail. We value your feedback.  ANobie Putnam DO [facility]

## 2022-04-21 NOTE — Progress Notes (Signed)
Subjective:    Patient ID: Tanya Barry, female    DOB: 1965-04-18, 57 y.o.   MRN: [id number]  Tanya Barry is a 57 y.o. female presenting on 04/21/2022 for Annual Exam   HPI  Here for Annual Physical and Lab Orders  Migraine Headaches Chronic history of migraine headaches for most of life. She has done well Currently no recent concerns, seem improved w/ job change and less stress Takes Sumatriptan AS NEEDED with good results.   Allergic Rhinosinusitis / Seasonal Allergies   Hypothyroidism Stable chronic issue since 2009 Labs due today Doing well with Levothyroxine 141mg daily   Chronic Back and Neck Pain / Osteoarthritis / DJD with thoracolumbar spinal stenosis History of traumatic injury 2009 thrown off horse and broke her back, had a laminectomy surgery w hardware T12-L1, ultimately developed issues with hypertension and had chest pain and cardiac symptoms after this injury and HTN, she had a cardiac catheterization and ultimately no blockages, they determined anginal symptoms were triggered by "bruising" on the heart.   Taking Cymbalta 620mdaily, for about past 10 years for chronic back pain. Helps with her aching pain, takes in PM. Also helps her feet, can get some numbness in toes if stand too long. - Cervical Lumbar ESI injections from Dr LiOleta Mousepreviously   CHRONIC HTN: Controlled BP Current Meds - Benazepril 1080maily   Reports good compliance, took meds today. Tolerating well, w/o complaints. Lifestyle: - Exercise: walking with dog regularly, limited gym exercise due to hardware in back. Denies CP, dyspnea, HA, edema, dizziness / lightheadedness  Additional concern  Sinusitis Residual from previous Has sinus congestion, drainage, some occasional sinus pressure Taking Sudafed AS NEEDED   Health Maintenance: Colon Cancer Screening 05/07/21, negative, repeat 3 years, 2026  Mammogram 12/03/21, negative, repeat yearly  UTD Vaccines including COVID  Shingles Tdap, Flu  History of s/p Total Hysterectomy 1995, no pap smear since 2000      04/21/2022    8:30 AM 07/06/2020    2:57 PM 06/24/2019    2:02 PM  Depression screen PHQ 2/9  Decreased Interest 0 0 0  Down, Depressed, Hopeless 0 0 0  PHQ - 2 Score 0 0 0  Altered sleeping 0 0   Tired, decreased energy 1 0   Change in appetite 0 0   Feeling bad or failure about yourself  0 0   Trouble concentrating 0 0   Moving slowly or fidgety/restless 0 0   Suicidal thoughts 0 0   PHQ-9 Score 1 0   Difficult doing work/chores Not difficult at all        04/21/2022    8:31 AM 07/06/2020    2:57 PM  GAD 7 : Generalized Anxiety Score  Nervous, Anxious, on Edge 0 0  Control/stop worrying 0 0  Worry too much - different things 0 0  Trouble relaxing 0 0  Restless 0 0  Easily annoyed or irritable 2 0  Afraid - awful might happen 0 0  Total GAD 7 Score 2 0  Anxiety Difficulty Not difficult at all       Past Medical History:  Diagnosis Date   Allergy    Hypertension    Osteopenia    Thyroid disease    Past Surgical History:  Procedure Laterality Date   ELBOW SURGERY Right 2005   Cubital Tunnel Nerve Release   LAMINECTOMY  2009   Laminectomy T12 fusion, with hardware rods/screws from T12-L1   TOTAL ABDOMINAL HYSTERECTOMY  1995   Total hysterectomy   Social History   Socioeconomic History   Marital status: Married    Spouse name: Not on file   Number of children: Not on file   Years of education: Not on file   Highest education level: Not on file  Occupational History   Not on file  Tobacco Use   Smoking status: Never   Smokeless tobacco: Never  Vaping Use   Vaping Use: Never used  Substance and Sexual Activity   Alcohol use: Yes   Drug use: Never   Sexual activity: Not on file  Other Topics Concern   Not on file  Social History Narrative   Not on file   Social Determinants of Health   Financial Resource Strain: Not on file  Food Insecurity: Not on file   Transportation Needs: Not on file  Physical Activity: Not on file  Stress: Not on file  Social Connections: Not on file  Intimate Partner Violence: Not on file   Family History  Problem Relation Age of Onset   Heart disease Mother    Stroke Mother    Thyroid disease Mother    Alzheimer's disease Mother        living in [place] disease Father    Cancer Father        prostate    Aneurysm Father        brain   Current Outpatient Medications on File Prior to Visit  Medication Sig   Ascorbic Acid (VITA-C PO) Take 500 mg by mouth.    benazepril (LOTENSIN) 10 MG tablet TAKE 1 TABLET BY MOUTH DAILY.   DULoxetine (CYMBALTA) 60 MG capsule TAKE 1 CAPSULE (60 MG TOTAL) BY MOUTH DAILY.   fluticasone (FLONASE) 50 MCG/ACT nasal spray Place 2 sprays into both nostrils daily.   levothyroxine (SYNTHROID) 112 MCG tablet TAKE 1 TABLET BY MOUTH DAILY BEFORE BREAKFAST.   loratadine (CLARITIN) 10 MG tablet TAKE 1 TABLET DAILY   montelukast (SINGULAIR) 10 MG tablet TAKE 1 TABLET BY MOUTH AT BEDTIME.   Multiple Vitamin (MULTIVITAMIN) tablet Take 1 tablet by mouth daily.   Probiotic Product (PROBIOTIC-10 PO) Take by mouth.   SUMAtriptan (IMITREX) 50 MG tablet TAKE 1 TABLET BY MOUTH ONCE AS NEEDED FOR UP TO 1 DOSE FOR MIGRAINE. MAY REPEAT ONE DOSE IN 2 HOURS IF HEADACHE PERSISTS, FOR MAX DOSE   WEGOVY 1 MG/0.5ML SOAJ Inject 1 mg into the skin once a week.   No current facility-administered medications on file prior to visit.    Review of Systems  Constitutional:  Negative for activity change, appetite change, chills, diaphoresis, fatigue and fever.  HENT:  Negative for congestion and hearing loss.   Eyes:  Negative for visual disturbance.  Respiratory:  Negative for cough, chest tightness, shortness of breath and wheezing.   Cardiovascular:  Negative for chest pain, palpitations and leg swelling.  Gastrointestinal:  Negative for abdominal pain, constipation, diarrhea, nausea and  vomiting.  Genitourinary:  Negative for dysuria, frequency and hematuria.  Musculoskeletal:  Negative for arthralgias and neck pain.  Skin:  Negative for rash.  Neurological:  Negative for dizziness, weakness, light-headedness, numbness and headaches.  Hematological:  Negative for adenopathy.  Psychiatric/Behavioral:  Negative for behavioral problems, dysphoric mood and sleep disturbance.    Per HPI unless specifically indicated above      Objective:    BP 120/68   Pulse 63   Ht 5' 4"$  (1.626 m)   Wt 173 lb 6.4  oz (78.7 kg)   SpO2 95%   BMI 29.76 kg/m   Wt Readings from Last 3 Encounters:  04/21/22 173 lb 6.4 oz (78.7 kg)  03/25/22 175 lb 6.4 oz (79.6 kg)  08/02/21 192 lb 4.8 oz (87.2 kg)    Physical Exam Vitals and nursing note reviewed.  Constitutional:      General: She is not in acute distress.    Appearance: She is well-developed. She is not diaphoretic.     Comments: Well-appearing, comfortable, cooperative  HENT:     Head: Normocephalic and atraumatic.     Right Ear: Tympanic membrane, ear canal and external ear normal. There is no impacted cerumen.     Left Ear: Tympanic membrane, ear canal and external ear normal. There is no impacted cerumen.  Eyes:     General:        Right eye: No discharge.        Left eye: No discharge.     Conjunctiva/sclera: Conjunctivae normal.     Pupils: Pupils are equal, round, and reactive to light.  Neck:     Thyroid: No thyromegaly.     Vascular: No carotid bruit.  Cardiovascular:     Rate and Rhythm: Normal rate and regular rhythm.     Pulses: Normal pulses.     Heart sounds: Normal heart sounds. No murmur heard. Pulmonary:     Effort: Pulmonary effort is normal. No respiratory distress.     Breath sounds: Normal breath sounds. No wheezing or rales.  Abdominal:     General: Bowel sounds are normal. There is no distension.     Palpations: Abdomen is soft. There is no mass.     Tenderness: There is no abdominal tenderness.   Musculoskeletal:        General: No tenderness. Normal range of motion.     Cervical back: Normal range of motion and neck supple.     Right lower leg: No edema.     Left lower leg: No edema.     Comments: Upper / Lower Extremities: - Normal muscle tone, strength bilateral upper extremities 5/5, lower extremities 5/5  Lymphadenopathy:     Cervical: No cervical adenopathy.  Skin:    General: Skin is warm and dry.     Findings: No erythema or rash.  Neurological:     Mental Status: She is alert and oriented to person, place, and time.     Comments: Distal sensation intact to light touch all extremities  Psychiatric:        Mood and Affect: Mood normal.        Behavior: Behavior normal.        Thought Content: Thought content normal.     Comments: Well groomed, good eye contact, normal speech and thoughts       Results for orders placed or performed in visit on 04/24/21  T4, free  Result Value Ref Range   Free T4 1.5 0.8 - 1.8 ng/dL  TSH  Result Value Ref Range   TSH 0.09 (L) 0.40 - 4.50 mIU/L  Hemoglobin A1c  Result Value Ref Range   Hgb A1c MFr Bld 5.3 <5.7 % of total Hgb   Mean Plasma Glucose 105 mg/dL   eAG (mmol/L) 5.8 mmol/L  CBC with Differential/Platelet  Result Value Ref Range   WBC 5.2 3.8 - 10.8 Thousand/uL   RBC 4.48 3.80 - 5.10 Million/uL   Hemoglobin 12.8 11.7 - 15.5 g/dL   HCT 40.3 35.0 - 45.0 %  MCV 90.0 80.0 - 100.0 fL   MCH 28.6 27.0 - 33.0 pg   MCHC 31.8 (L) 32.0 - 36.0 g/dL   RDW 12.9 11.0 - 15.0 %   Platelets 364 140 - 400 Thousand/uL   MPV 10.6 7.5 - 12.5 fL   Neutro Abs 2,480 1,500 - 7,800 cells/uL   Lymphs Abs 2,090 850 - 3,900 cells/uL   Absolute Monocytes 369 200 - 950 cells/uL   Eosinophils Absolute 182 15 - 500 cells/uL   Basophils Absolute 78 0 - 200 cells/uL   Neutrophils Relative % 47.7 %   Total Lymphocyte 40.2 %   Monocytes Relative 7.1 %   Eosinophils Relative 3.5 %   Basophils Relative 1.5 %  Lipid panel  Result Value Ref  Range   Cholesterol 166 <200 mg/dL   HDL 50 > OR = 50 mg/dL   Triglycerides 111 <150 mg/dL   LDL Cholesterol (Calc) 95 mg/dL (calc)   Total CHOL/HDL Ratio 3.3 <5.0 (calc)   Non-HDL Cholesterol (Calc) 116 <130 mg/dL (calc)  COMPLETE METABOLIC PANEL WITH GFR  Result Value Ref Range   Glucose, Bld 90 65 - 99 mg/dL   BUN 14 7 - 25 mg/dL   Creat 0.64 0.50 - 1.03 mg/dL   eGFR 102 > OR = 60 mL/min/1.6m   BUN/Creatinine Ratio NOT APPLICABLE 6 - 22 (calc)   Sodium 139 135 - 146 mmol/L   Potassium 4.4 3.5 - 5.3 mmol/L   Chloride 105 98 - 110 mmol/L   CO2 27 20 - 32 mmol/L   Calcium 9.4 8.6 - 10.4 mg/dL   Total Protein 6.9 6.1 - 8.1 g/dL   Albumin 3.9 3.6 - 5.1 g/dL   Globulin 3.0 1.9 - 3.7 g/dL (calc)   AG Ratio 1.3 1.0 - 2.5 (calc)   Total Bilirubin 0.5 0.2 - 1.2 mg/dL   Alkaline phosphatase (APISO) 70 37 - 153 U/L   AST 18 10 - 35 U/L   ALT 16 6 - 29 U/L      Assessment & Plan:   Problem List Items Addressed This Visit     Acquired hypothyroidism    Controlled hypothyroidism Asymptomatic Due for repeat Thyroid panel today On levothyroxine currently 1121m daily      Relevant Orders   TSH   T4, free   Essential hypertension    Well-controlled HYPERTENSION  No known complications     Plan:  1. Continue current BP regimen - refilled Benazepril 1032maily 2. Encourage improved lifestyle - low sodium diet, regular exercise 3. May monitor BP outside office      Relevant Orders   COMPLETE METABOLIC PANEL WITH GFR   CBC with Differential/Platelet   Migraine with aura and without status migrainosus, not intractable    Stable On Sumatriptan AS NEEDED if need for migraine flares      Overweight (BMI 25.0-29.9)   Relevant Orders   COMPLETE METABOLIC PANEL WITH GFR   Other Visit Diagnoses     Annual physical exam    -  Primary   Relevant Orders   COMPLETE METABOLIC PANEL WITH GFR   CBC with Differential/Platelet   Hemoglobin A1c   Lipid panel   TSH   T4, free    Elevated LDL cholesterol level       Relevant Orders   Lipid panel   Abnormal glucose       Relevant Orders   Hemoglobin A1c       Updated Health Maintenance information Fasting labs ordered  for today, pending results. Encouraged improvement to lifestyle with diet and exercise Goal of weight loss 20 lb wt loss in past 9-12 months approximately Continues on Wegovy 43m weekly inj  Wegovy 19mweekly, continue for now, in future may not be covered if BMI < 30, we can consider transition to oral Contrave $99 per month through mail order pharmacy [facility].  No orders of the defined types were placed in this encounter.     Follow up plan: Return in about 1 year (around 04/22/2023) for 1 year Annual Physical and Labs AFTER.  AlNobie PutnamDOStansbury Parkedical Group 04/21/2022, 8:24 AM

## 2022-04-22 LAB — CBC WITH DIFFERENTIAL/PLATELET
Absolute Monocytes: 710 cells/uL (ref 200–950)
Basophils Absolute: 58 cells/uL (ref 0–200)
Basophils Relative: 0.9 %
Eosinophils Absolute: 102 cells/uL (ref 15–500)
Eosinophils Relative: 1.6 %
HCT: 38.1 % (ref 35.0–45.0)
Hemoglobin: 12.6 g/dL (ref 11.7–15.5)
Lymphs Abs: 1638 cells/uL (ref 850–3900)
MCH: 29.4 pg (ref 27.0–33.0)
MCHC: 33.1 g/dL (ref 32.0–36.0)
MCV: 89 fL (ref 80.0–100.0)
MPV: 10.4 fL (ref 7.5–12.5)
Monocytes Relative: 11.1 %
Neutro Abs: 3891 cells/uL (ref 1500–7800)
Neutrophils Relative %: 60.8 %
Platelets: 362 10*3/uL (ref 140–400)
RBC: 4.28 10*6/uL (ref 3.80–5.10)
RDW: 12.8 % (ref 11.0–15.0)
Total Lymphocyte: 25.6 %
WBC: 6.4 10*3/uL (ref 3.8–10.8)

## 2022-04-22 LAB — COMPLETE METABOLIC PANEL WITH GFR
AG Ratio: 1.4 (calc) (ref 1.0–2.5)
ALT: 11 U/L (ref 6–29)
AST: 14 U/L (ref 10–35)
Albumin: 4.1 g/dL (ref 3.6–5.1)
Alkaline phosphatase (APISO): 74 U/L (ref 37–153)
BUN: 13 mg/dL (ref 7–25)
CO2: 26 mmol/L (ref 20–32)
Calcium: 9.8 mg/dL (ref 8.6–10.4)
Chloride: 104 mmol/L (ref 98–110)
Creat: 0.67 mg/dL (ref 0.50–1.03)
Globulin: 2.9 g/dL (calc) (ref 1.9–3.7)
Glucose, Bld: 64 mg/dL — ABNORMAL LOW (ref 65–99)
Potassium: 4.3 mmol/L (ref 3.5–5.3)
Sodium: 139 mmol/L (ref 135–146)
Total Bilirubin: 0.4 mg/dL (ref 0.2–1.2)
Total Protein: 7 g/dL (ref 6.1–8.1)
eGFR: 101 mL/min/{1.73_m2} (ref >=60)

## 2022-04-22 LAB — HEMOGLOBIN A1C
Hgb A1c MFr Bld: 5.1 % of total Hgb (ref <5.7)
Mean Plasma Glucose: 100 mg/dL
eAG (mmol/L): 5.5 mmol/L

## 2022-04-22 LAB — LIPID PANEL
Cholesterol: 186 mg/dL (ref <200)
HDL: 59 mg/dL (ref >=50)
LDL Cholesterol (Calc): 111 mg/dL (calc) — ABNORMAL HIGH
Non-HDL Cholesterol (Calc): 127 mg/dL (calc) (ref <130)
Total CHOL/HDL Ratio: 3.2 (calc) (ref <5.0)
Triglycerides: 75 mg/dL (ref <150)

## 2022-04-22 LAB — T4, FREE: Free T4: 1.1 ng/dL (ref 0.8–1.8)

## 2022-04-22 LAB — TSH: TSH: 3.51 mIU/L (ref 0.40–4.50)

## 2022-04-30 ENCOUNTER — Encounter: Payer: Self-pay | Admitting: Family Medicine

## 2022-05-15 ENCOUNTER — Other Ambulatory Visit: Payer: Self-pay

## 2022-05-16 ENCOUNTER — Other Ambulatory Visit: Payer: Self-pay

## 2022-05-16 ENCOUNTER — Other Ambulatory Visit (HOSPITAL_COMMUNITY): Payer: Self-pay

## 2022-05-16 DIAGNOSIS — E669 Obesity, unspecified: Secondary | ICD-10-CM

## 2022-05-16 MED ORDER — WEGOVY 1 MG/0.5ML ~~LOC~~ SOAJ: 1.0000 mg | SUBCUTANEOUS | 2 refills | Status: DC

## 2022-05-20 ENCOUNTER — Other Ambulatory Visit: Payer: Self-pay

## 2022-05-27 ENCOUNTER — Other Ambulatory Visit: Payer: Self-pay

## 2022-05-30 ENCOUNTER — Other Ambulatory Visit: Payer: Self-pay

## 2022-05-30 DIAGNOSIS — E669 Obesity, unspecified: Secondary | ICD-10-CM

## 2022-05-30 MED ORDER — WEGOVY 1 MG/0.5ML ~~LOC~~ SOAJ
1.0000 mg | SUBCUTANEOUS | 2 refills | Status: AC
  Filled 2022-05-30: qty 2, 28d supply, fill #0
  Filled 2022-07-03: qty 2, 28d supply, fill #1

## 2022-06-02 ENCOUNTER — Other Ambulatory Visit: Payer: Self-pay

## 2022-07-03 ENCOUNTER — Other Ambulatory Visit: Payer: Self-pay

## 2022-07-15 ENCOUNTER — Other Ambulatory Visit: Payer: Self-pay

## 2022-07-15 ENCOUNTER — Other Ambulatory Visit: Payer: Self-pay | Admitting: Family Medicine

## 2022-07-15 DIAGNOSIS — G43109 Migraine with aura, not intractable, without status migrainosus: Secondary | ICD-10-CM

## 2022-07-15 MED FILL — Sumatriptan Succinate Tab 50 MG: ORAL | 15 days supply | Qty: 9 | Fill #0 | Status: CN

## 2022-07-15 NOTE — Telephone Encounter (Signed)
Requested Prescriptions  Pending Prescriptions Disp Refills   SUMAtriptan (IMITREX) 50 MG tablet 9 tablet 2    Sig: TAKE 1 TABLET BY MOUTH ONCE AS NEEDED FOR UP TO 1 DOSE FOR MIGRAINE. MAY REPEAT ONE DOSE IN 2 HOURS IF HEADACHE PERSISTS, FOR MAX DOSE     Neurology:  Migraine Therapy - Triptan Passed - 07/15/2022  1:28 PM      Passed - Last BP in normal range    BP Readings from Last 1 Encounters:  04/21/22 120/68         Passed - Valid encounter within last 12 months    Recent Outpatient Visits           2 months ago Annual physical exam   [facility] [place], Netta Neat, DO   3 months ago Acute pharyngitis, unspecified etiology   [facility] Smitty Cords, DO   11 months ago Obesity (BMI 30.0-34.9)   [facility] Althe[facility] Charon, Netta Neat, DO   1 year ago Annual physical exam    [facility] Smitty Cords, DO   2 years ago Acute cystitis with hematuria    [facility], Netta Neat, [place]

## 2022-07-16 ENCOUNTER — Other Ambulatory Visit: Payer: Self-pay

## 2022-07-17 ENCOUNTER — Other Ambulatory Visit: Payer: Self-pay

## 2022-07-24 DIAGNOSIS — H5203 Hypermetropia, bilateral: Secondary | ICD-10-CM | POA: Diagnosis not present

## 2022-07-24 DIAGNOSIS — Z0100 Encounter for examination of eyes and vision without abnormal findings: Secondary | ICD-10-CM | POA: Diagnosis not present

## 2022-07-29 ENCOUNTER — Other Ambulatory Visit: Payer: Self-pay

## 2022-08-04 ENCOUNTER — Ambulatory Visit
Admission: EM | Admit: 2022-08-04 | Discharge: 2022-08-04 | Disposition: A | Discharge status: Home / Self Care | Payer: Commercial Managed Care - PPO | Attending: Family Medicine | Admitting: Family Medicine

## 2022-08-04 ENCOUNTER — Ambulatory Visit (INDEPENDENT_AMBULATORY_CARE_PROVIDER_SITE_OTHER): Payer: Commercial Managed Care - PPO

## 2022-08-04 DIAGNOSIS — S60222A Contusion of left hand, initial encounter: Secondary | ICD-10-CM

## 2022-08-04 NOTE — ED Provider Notes (Signed)
[facility]    CSN: [id number] Arrival date & time: 08/04/22  1411      History   Chief Complaint Chief Complaint  Patient presents with   Finger Injury    HPI  HPI Tanya Barry is a 57 y.o. female.   Maurissa presents for left pinky pain that started after getting her finger trapped between a fence and a table today.  The area is red and somewhat swollen.  She wants to be sure she did not break it.  Applied ice prior to arrival.  She has otherwise been in her normal state of health.  Has no additional concerns today.    Past Medical History:  Diagnosis Date   Allergy    Hypertension    Osteopenia    Thyroid disease     Patient Active Problem List   Diagnosis Date Noted   Overweight (BMI 25.0-29.9) 08/02/2021   Acquired hypothyroidism 05/27/2019   Essential hypertension 05/27/2019   Seasonal allergies 05/27/2019   Migraine with aura and without status migrainosus, not intractable 05/27/2019   Fusion of spine of thoracolumbar region 09/22/2015   Stenosis of lumbosacral spine 09/22/2015    Past Surgical History:  Procedure Laterality Date   ELBOW SURGERY Right 2005   Cubital Tunnel Nerve Release   LAMINECTOMY  2009   Laminectomy T12 fusion, with hardware rods/screws from T12-L1   TOTAL ABDOMINAL HYSTERECTOMY  1995   Total hysterectomy    OB History   No obstetric history on file.      Home Medications    Prior to Admission medications   Medication Sig Start Date End Date Taking? Authorizing Provider  Ascorbic Acid (VITA-C PO) Take 500 mg by mouth.    Yes [provider]  benazepril (LOTENSIN) 10 MG tablet TAKE 1 TABLET BY MOUTH DAILY. 08/02/21 08/18/22 Yes Karamalegos, Netta Neat, DO  DULoxetine (CYMBALTA) 60 MG capsule TAKE 1 CAPSULE (60 MG TOTAL) BY MOUTH DAILY. 08/02/21 08/18/22 Yes Karamalegos, Netta Neat, DO  fluticasone (FLONASE) 50 MCG/ACT nasal spray Place 2 sprays into both nostrils daily. 08/30/19  Yes Karamalegos,  Netta Neat, DO  levothyroxine (SYNTHROID) 112 MCG tablet TAKE 1 TABLET BY MOUTH DAILY BEFORE BREAKFAST. 08/02/21 08/04/22 Yes Karamalegos, Netta Neat, DO  loratadine (CLARITIN) 10 MG tablet TAKE 1 TABLET DAILY 12/06/18  Yes [provider]  montelukast (SINGULAIR) 10 MG tablet TAKE 1 TABLET BY MOUTH AT BEDTIME. 08/02/21 08/18/22 Yes Karamalegos, Netta Neat, DO  Multiple Vitamin (MULTIVITAMIN) tablet Take 1 tablet by mouth daily.   Yes [provider]  Probiotic Product (PROBIOTIC-10 PO) Take by mouth.   Yes [provider]  SUMAtriptan (IMITREX) 50 MG tablet Take 1 tablet (50 mg total) by mouth once as needed for up to 1 dose FOR MIGRAINE. MAY REPEAT ONE DOSE IN 2 HOURS IF HEADACHE PERSISTS, FOR MAX DOSE 07/15/22  Yes Karamalegos, Alexander J, DO  WEGOVY 1 MG/0.5ML SOAJ Inject 1 mg into the skin once a week. 05/30/22  Yes Karamalegos, Netta Neat, DO    Family History Family History  Problem Relation Age of Onset   Heart disease Mother    Stroke Mother    Thyroid disease Mother    Alzheimer's disease Mother        living in [place]   Heart disease Father    Cancer Father        prostate    Aneurysm Father        brain    Social History Social  History   Tobacco Use   Smoking status: Never   Smokeless tobacco: Never  Vaping Use   Vaping Use: Never used  Substance Use Topics   Alcohol use: Yes   Drug use: Never     Allergies   Gabapentin and Aspirin   Review of Systems Review of Systems: :negative unless otherwise stated in HPI.      Physical Exam Triage Vital Signs ED Triage Vitals  Enc Vitals Group     BP 08/04/22 1558 129/80     Pulse Rate 08/04/22 1558 60     Resp 08/04/22 1558 16     Temp 08/04/22 1558 98.1 F (36.7 C)     Temp Source 08/04/22 1558 Oral     SpO2 08/04/22 1558 100 %     Weight 08/04/22 1558 170 lb (77.1 kg)     Height 08/04/22 1558 5\' 4"  (1.626 m)     Head Circumference --      Peak Flow --      Pain Score  08/04/22 1601 8     Pain Loc --      Pain Edu? --      Excl. in GC? --    No data found.  Updated Vital Signs BP 129/80 (BP Location: Left Arm)   Pulse 60   Temp 98.1 F (36.7 C) (Oral)   Resp 16   Ht 5\' 4"  (1.626 m)   Wt 77.1 kg   SpO2 100%   BMI 29.18 kg/m   Visual Acuity Right Eye Distance:   Left Eye Distance:   Bilateral Distance:    Right Eye Near:   Left Eye Near:    Bilateral Near:     Physical Exam GEN: well appearing female in no acute distress  CVS: well perfused  RESP: speaking in full sentences without pause, no respiratory distress  MSK: left 5th finger No evidence of bony deformity, asymmetry, or muscle atrophy. Mild distal edema and erythema  Tenderness over 5th MCP joint Full active and passive ROM  Strength 5/5 grip, interosseous muscles  Nontender metacarpals and carpals  Sensation intact. Peripheral pulses intact.   UC Treatments / Results  Labs (all labs ordered are listed, but only abnormal results are displayed) Labs Reviewed - No data to display  EKG   Radiology DG Hand Complete Left  Result Date: 08/04/2022 CLINICAL DATA:  Pain and bruising after injury today. EXAM: LEFT HAND - COMPLETE 3+ VIEW COMPARISON:  None Available. FINDINGS: There is no evidence of fracture or dislocation. Short pinky finger middle phalanx is congenital. There is no evidence of arthropathy or other focal bone abnormality. Soft tissues are unremarkable. IMPRESSION: Negative radiographs of the left hand. Electronically Signed   By: Narda Rutherford M.D.   On: 08/04/2022 16:54      Procedures Procedures (including critical care time)  Medications Ordered in UC Medications - No data to display  Initial Impression / Assessment and Plan / UC Course  I have reviewed the triage vital signs and the nursing notes.  Pertinent labs & imaging results that were available during my care of the patient were reviewed by me and considered in my medical decision making  (see chart for details).      Pt is a 57 y.o.  female with acute onset left hand pain after injuring it today.  On exam, she has ecchymosis/erythema of the MCP joint.  Obtained left hand x-rays that were personally reviewed by me and unremarkable for fracture or  dislocation.  Radiologist report reviewed and agrees.   Patient to gradually return to normal activities, as tolerated and continue ordinary activities within the limits permitted by pain. Motrin and Tylenol PRN for pain. Patient to follow up with orthopedic provider, if symptoms do not improve with conservative treatment.  Return and ED precautions given. Understanding voiced. Discussed MDM, treatment plan and plan for follow-up with patient who agrees with plan.   Final Clinical Impressions(s) / UC Diagnoses   Final diagnoses:  Contusion of left hand, initial encounter     Discharge Instructions      Your xray did not show any out of place or broken bones. Apply cool compresses for the next 48 hours then switch to warm compresses.  Follow up with your orthopedic provider, if you continue to have hand pain after 7 days.      ED Prescriptions   None    PDMP not reviewed this encounter.   Katha Cabal, DO 08/11/22 0246

## 2022-08-04 NOTE — ED Triage Notes (Signed)
Pt c/o L pinky finger injury that occurred today. States she was putting up a fence & got in between table.

## 2022-08-04 NOTE — Discharge Instructions (Addendum)
Your xray did not show any out of place or broken bones. Apply cool compresses for the next 48 hours then switch to warm compresses.  Follow up with your orthopedic provider, if you continue to have hand pain after 7 days.

## 2022-09-12 ENCOUNTER — Other Ambulatory Visit: Payer: Self-pay | Admitting: Family Medicine

## 2022-09-12 ENCOUNTER — Other Ambulatory Visit: Payer: Self-pay

## 2022-09-12 DIAGNOSIS — I10 Essential (primary) hypertension: Secondary | ICD-10-CM

## 2022-09-12 DIAGNOSIS — M4325 Fusion of spine, thoracolumbar region: Secondary | ICD-10-CM

## 2022-09-12 DIAGNOSIS — M4807 Spinal stenosis, lumbosacral region: Secondary | ICD-10-CM

## 2022-09-12 MED ORDER — DULOXETINE HCL 60 MG PO CPEP
ORAL_CAPSULE | Freq: Every day | ORAL | 2 refills | Status: AC
  Filled 2022-09-12: qty 90, 90d supply, fill #0
  Filled 2022-12-10 – 2022-12-26 (×2): qty 90, 90d supply, fill #1

## 2022-09-12 MED ORDER — BENAZEPRIL HCL 10 MG PO TABS
10.0000 mg | ORAL_TABLET | Freq: Every day | ORAL | 2 refills | Status: AC
  Filled 2022-09-12: qty 90, 90d supply, fill #0
  Filled 2022-12-10 – 2022-12-26 (×2): qty 90, 90d supply, fill #1

## 2022-09-12 NOTE — Telephone Encounter (Signed)
Requested Prescriptions  Pending Prescriptions Disp Refills   DULoxetine (CYMBALTA) 60 MG capsule 90 capsule 3    Sig: TAKE 1 CAPSULE (60 MG TOTAL) BY MOUTH DAILY.     Psychiatry: Antidepressants - SNRI - duloxetine Passed - 09/12/2022 10:35 AM      Passed - Cr in normal range and within 360 days    Creat  Date Value Ref Range Status  04/21/2022 0.67 0.50 - 1.03 mg/dL Final         Passed - eGFR is 30 or above and within 360 days    GFR, Est African American  Date Value Ref Range Status  06/17/2019 117 > OR = 60 mL/min/1.18m2 Final   GFR, Est Non African American  Date Value Ref Range Status  06/17/2019 101 > OR = 60 mL/min/1.65m2 Final   eGFR  Date Value Ref Range Status  04/21/2022 101 > OR = 60 mL/min/1.48m2 Final         Passed - Completed PHQ-2 or PHQ-9 in the last 360 days      Passed - Last BP in normal range    BP Readings from Last 1 Encounters:  08/04/22 129/80         Passed - Valid encounter within last 6 months    Recent Outpatient Visits           4 months ago Annual physical exam   [facility] [place], Netta Neat, DO   5 months ago Acute pharyngitis, unspecified etiology   [place] [facility] [place], Netta Neat, DO   1 year ago Obesity (BMI 30.0-34.9)   [place] [facility] Althea Charon, Netta Neat, DO   1 year ago Annual physical exam   [place] [facility] Smitty Cords, DO   2 years ago Acute cystitis with hematuria   [place] [facility], Netta Neat, DO               benazepril (LOTENSIN) 10 MG tablet 90 tablet 3    Sig: TAKE 1 TABLET BY MOUTH DAILY.     Cardiovascular:  ACE Inhibitors Passed - 09/12/2022 10:35 AM      Passed - Cr in normal range and within 180 days    Creat  Date Value Ref Range Status  04/21/2022 0.67 0.50 - 1.03 mg/dL Final         Passed - K in normal range and within 180 days     Potassium  Date Value Ref Range Status  04/21/2022 4.3 3.5 - 5.3 mmol/L Final         Passed - Patient is not pregnant      Passed - Last BP in normal range    BP Readings from Last 1 Encounters:  08/04/22 129/80         Passed - Valid encounter within last 6 months    Recent Outpatient Visits           4 months ago Annual physical exam   [facility] [place], Netta Neat, DO   5 months ago Acute pharyngitis, unspecified etiology   [place] [facility] [place], Netta Neat, DO   1 year ago Obesity (BMI 30.0-34.9)   [place] [facility] Althea Charon, Netta Neat, DO   1 year ago Annual physical exam   [place] [facility] [place], Netta Neat, [place]  2 years ago Acute cystitis with hematuria   [place] [facility] [place], Netta Neat, [place]

## 2022-12-10 ENCOUNTER — Other Ambulatory Visit: Payer: Self-pay

## 2022-12-10 ENCOUNTER — Other Ambulatory Visit: Payer: Self-pay | Admitting: Family Medicine

## 2022-12-10 ENCOUNTER — Encounter: Payer: Self-pay | Admitting: Family Medicine

## 2022-12-10 DIAGNOSIS — J302 Other seasonal allergic rhinitis: Secondary | ICD-10-CM

## 2022-12-10 DIAGNOSIS — E039 Hypothyroidism, unspecified: Secondary | ICD-10-CM

## 2022-12-10 MED FILL — Sumatriptan Succinate Tab 50 MG: ORAL | 15 days supply | Qty: 9 | Fill #0 | Status: CN

## 2022-12-10 NOTE — Telephone Encounter (Signed)
Called to verify pharmacy. Left VM to call back

## 2022-12-11 ENCOUNTER — Other Ambulatory Visit: Payer: Self-pay

## 2022-12-11 ENCOUNTER — Other Ambulatory Visit: Payer: Self-pay | Admitting: Family Medicine

## 2022-12-11 DIAGNOSIS — E039 Hypothyroidism, unspecified: Secondary | ICD-10-CM

## 2022-12-11 DIAGNOSIS — J302 Other seasonal allergic rhinitis: Secondary | ICD-10-CM

## 2022-12-11 MED ORDER — MONTELUKAST SODIUM 10 MG PO TABS: ORAL_TABLET | Freq: Every day | ORAL | 0 refills | Status: DC

## 2022-12-11 MED ORDER — LEVOTHYROXINE SODIUM 112 MCG PO TABS: ORAL_TABLET | Freq: Every day | ORAL | 3 refills | Status: AC

## 2022-12-11 NOTE — Telephone Encounter (Signed)
Requested medications are due for refill today.  yes  Requested medications are on the active medications list.  yes  Last refill. 08/02/2021 #90 3 rf  Future visit scheduled.   no  Notes to clinic.  Rx's written are expired.    Requested Prescriptions  Pending Prescriptions Disp Refills   montelukast (SINGULAIR) 10 MG tablet 90 tablet 3    Sig: TAKE 1 TABLET BY MOUTH AT BEDTIME.     Pulmonology:  Leukotriene Inhibitors Passed - 12/10/2022 10:08 AM      Passed - Valid encounter within last 12 months    Recent Outpatient Visits           7 months ago Annual physical exam   [place] [facility] [place], Netta Neat, DO   8 months ago Acute pharyngitis, unspecified etiology   [place] [facility] [place], Netta Neat, DO   1 year ago Obesity (BMI 30.0-34.9)   [place] [facility] Al[facility] Charon, Netta Neat, DO   1 year ago Annual physical exam   [facility] Smitty Cords, DO   2 years ago Acute cystitis with hematuria   [place] [facility] Smitty Cords, DO               levothyroxine (SYNTHROID) 112 MCG tablet 90 tablet 3    Sig: TAKE 1 TABLET BY MOUTH DAILY BEFORE BREAKFAST.     Endocrinology:  Hypothyroid Agents Passed - 12/10/2022 10:08 AM      Passed - TSH in normal range and within 360 days    TSH  Date Value Ref Range Status  04/21/2022 3.51 0.40 - 4.50 mIU/L Final         Passed - Valid encounter within last 12 months    Recent Outpatient Visits           7 months ago Annual physical exam   [place] [facility] [place], Netta Neat, DO   8 months ago Acute pharyngitis, unspecified etiology   [place] [facility] [place], Netta Neat, DO   1 year ago Obesity (BMI 30.0-34.9)   [place] [facility] Althea Charon, Netta Neat, DO   1 year ago Annual physical exam    [place] [facility] Smitty Cords, DO   2 years ago Acute cystitis with hematuria   [place] [facility] [place], Netta Neat, [place]

## 2022-12-11 NOTE — Telephone Encounter (Signed)
Requested medication (s) are due for refill today: Yes  Requested medication (s) are on the active medication list: Yes  Last refill:  08/02/21  Future visit scheduled: No  Notes to clinic:  Unable to refill per protocol, Rx expired.      Requested Prescriptions  Pending Prescriptions Disp Refills   levothyroxine (SYNTHROID) 112 MCG tablet 90 tablet 3    Sig: TAKE 1 TABLET BY MOUTH DAILY BEFORE BREAKFAST.     Endocrinology:  Hypothyroid Agents Passed - 12/11/2022  9:50 AM      Passed - TSH in normal range and within 360 days    TSH  Date Value Ref Range Status  04/21/2022 3.51 0.40 - 4.50 mIU/L Final         Passed - Valid encounter within last 12 months    Recent Outpatient Visits           7 months ago Annual physical exam   [place] [facility] [place], Netta Neat, DO   8 months ago Acute pharyngitis, unspecified etiology   [place] [facility] [place], Netta Neat, DO   1 year ago Obesity (BMI 30.0-34.9)   [place] [facility] Althea Charon, Netta Neat, DO   1 year ago Annual physical exam   [place] [facility] Smitty Cords, DO   2 years ago Acute cystitis with hematuria   [place] [facility] [place], Netta Neat, DO              Refused Prescriptions Disp Refills   montelukast (SINGULAIR) 10 MG tablet 90 tablet 3    Sig: TAKE 1 TABLET BY MOUTH AT BEDTIME.     Pulmonology:  Leukotriene Inhibitors Passed - 12/11/2022  9:50 AM      Passed - Valid encounter within last 12 months    Recent Outpatient Visits           7 months ago Annual physical exam   [place] [facility] [place], Netta Neat, DO   8 months ago Acute pharyngitis, unspecified etiology   [place] [facility] [place], Netta Neat, DO   1 year ago Obesity (BMI 30.0-34.9)   [place] [facility] Althea Charon,  Netta Neat, DO   1 year ago Annual physical exam   [place] [facility] Smitty Cords, DO   2 years ago Acute cystitis with hematuria   [place] [place], Netta Neat, [place]

## 2022-12-11 NOTE — Telephone Encounter (Signed)
Duplicate request Requested Prescriptions  Pending Prescriptions Disp Refills   levothyroxine (SYNTHROID) 112 MCG tablet 90 tablet 3    Sig: TAKE 1 TABLET BY MOUTH DAILY BEFORE BREAKFAST.     Endocrinology:  Hypothyroid Agents Passed - 12/11/2022  9:50 AM      Passed - TSH in normal range and within 360 days    TSH  Date Value Ref Range Status  04/21/2022 3.51 0.40 - 4.50 mIU/L Final         Passed - Valid encounter within last 12 months    Recent Outpatient Visits           7 months ago Annual physical exam   [place] [facility] [place], Netta Neat, DO   8 months ago Acute pharyngitis, unspecified etiology   [facility] [place], Netta Neat, DO   1 year ago Obesity (BMI 30.0-34.9)   [place] [facility] Althea Charon, Netta Neat, DO   1 year ago Annual physical exam   [place] [facility] Smitty Cords, DO   2 years ago Acute cystitis with hematuria   [place] [facility], Netta Neat, DO               montelukast (SINGULAIR) 10 MG tablet 90 tablet 3    Sig: TAKE 1 TABLET BY MOUTH AT BEDTIME.     Pulmonology:  Leukotriene Inhibitors Passed - 12/11/2022  9:50 AM      Passed - Valid encounter within last 12 months    Recent Outpatient Visits           7 months ago Annual physical exam   [place] [facility] [place], Netta Neat, DO   8 months ago Acute pharyngitis, unspecified etiology   [place] [facility] [place], Netta Neat, DO   1 year ago Obesity (BMI 30.0-34.9)   [place] [facility] Althea Charon, Netta Neat, DO   1 year ago Annual physical exam   [place] [facility] Smitty Cords, DO   2 years ago Acute cystitis with hematuria    [facility] [place], Netta Neat, [place]

## 2022-12-26 ENCOUNTER — Other Ambulatory Visit: Payer: Self-pay

## 2022-12-29 ENCOUNTER — Other Ambulatory Visit: Payer: Self-pay

## 2023-03-14 ENCOUNTER — Other Ambulatory Visit: Payer: Self-pay | Admitting: Family Medicine

## 2023-03-14 DIAGNOSIS — J302 Other seasonal allergic rhinitis: Secondary | ICD-10-CM

## 2023-03-17 NOTE — Telephone Encounter (Signed)
 Requested Prescriptions  Pending Prescriptions Disp Refills   montelukast  (SINGULAIR ) 10 MG tablet [Pharmacy Med Name: montelukast  10 mg tablet (SINGULAIR )] 30 tablet 0    Sig: TAKE 1 TABLET BY MOUTH AT BEDTIME.     Pulmonology:  Leukotriene Inhibitors Passed - 03/17/2023  9:41 AM      Passed - Valid encounter within last 12 months    Recent Outpatient Visits           11 months ago Annual physical exam   [place] [facility] [place] Marsa PARAS, DO   11 months ago Acute pharyngitis, unspecified etiology   [place] [facility] [place], Marsa PARAS, DO   1 year ago Obesity (BMI 30.0-34.9)   [place] [facility] [place], Marsa PARAS, DO   1 year ago Annual physical exam   [place] [facility] Marsa PARAS, DO   2 years ago Acute cystitis with hematuria   [place] [facility] [place], Marsa PARAS, [place]

## 2023-03-17 NOTE — Telephone Encounter (Signed)
 Called pt to schedule CPE, pt unable to schedule at this time and disconnected the line.

## 2023-04-18 ENCOUNTER — Other Ambulatory Visit: Payer: Self-pay | Admitting: Family Medicine

## 2023-04-18 DIAGNOSIS — J302 Other seasonal allergic rhinitis: Secondary | ICD-10-CM

## 2023-04-20 NOTE — Telephone Encounter (Signed)
 Requested medications are due for refill today.  yes  Requested medications are on the active medications list.  yes  Last refill. 03/17/2023 330 0 rf  Future visit scheduled.   no  Notes to clinic.  Pt already given a curtesy refill.    Requested Prescriptions  Pending Prescriptions Disp Refills   montelukast  (SINGULAIR ) 10 MG tablet [Pharmacy Med Name: montelukast  10 mg tablet (SINGULAIR )] 30 tablet 0    Sig: TAKE 1 TABLET BY MOUTH AT BEDTIME.     Pulmonology:  Leukotriene Inhibitors Failed - 04/20/2023  1:22 PM      Failed - Valid encounter within last 12 months    Recent Outpatient Visits           12 months ago Annual physical exam   [place] [facility] Raina Bunting, DO   1 year ago Acute pharyngitis, unspecified etiology   [place] [facility] [place], Kayleen Party, DO   1 year ago Obesity (BMI 30.0-34.9)   [place] [facility] [place], Kayleen Party, DO   2 years ago Annual physical exam   [place] [facility] Raina Bunting, DO   2 years ago Acute cystitis with hematuria   [place] [facility] [place], Kayleen Party, [place]

## 2023-06-08 ENCOUNTER — Other Ambulatory Visit: Payer: Self-pay | Admitting: Family Medicine

## 2023-06-08 DIAGNOSIS — J302 Other seasonal allergic rhinitis: Secondary | ICD-10-CM

## 2023-06-10 ENCOUNTER — Other Ambulatory Visit: Payer: Self-pay | Admitting: Family Medicine

## 2023-06-10 DIAGNOSIS — J302 Other seasonal allergic rhinitis: Secondary | ICD-10-CM

## 2023-06-10 NOTE — Telephone Encounter (Signed)
 Requested medication (s) are due for refill today: yes  Requested medication (s) are on the active medication list: yes  Last refill:  04/20/23 #30  Future visit scheduled: no  Notes to clinic:  overdue for CPE- called pt and LM on VM to call back to make appointment for CPE   Requested Prescriptions  Pending Prescriptions Disp Refills   montelukast (SINGULAIR) 10 MG tablet [Pharmacy Med Name: montelukast 10 mg tablet (SINGULAIR)] 30 tablet 0    Sig: TAKE 1 TABLET BY MOUTH AT BEDTIME.     Pulmonology:  Leukotriene Inhibitors Failed - 06/10/2023  9:29 AM      Failed - Valid encounter within last 12 months    Recent Outpatient Visits   None

## 2023-06-11 NOTE — Telephone Encounter (Signed)
 Duplicate request last refill 06/10/23.  Requested Prescriptions  Pending Prescriptions Disp Refills   montelukast (SINGULAIR) 10 MG tablet [Pharmacy Med Name: montelukast 10 mg tablet (SINGULAIR)] 30 tablet 0    Sig: TAKE 1 TABLET BY MOUTH AT BEDTIME.     Pulmonology:  Leukotriene Inhibitors Failed - 06/11/2023  3:36 PM      Failed - Valid encounter within last 12 months    Recent Outpatient Visits   None

## 2023-07-01 ENCOUNTER — Other Ambulatory Visit: Payer: Self-pay | Admitting: Family Medicine

## 2023-07-01 DIAGNOSIS — J302 Other seasonal allergic rhinitis: Secondary | ICD-10-CM

## 2023-07-01 DIAGNOSIS — I10 Essential (primary) hypertension: Secondary | ICD-10-CM

## 2023-07-02 NOTE — Telephone Encounter (Signed)
 Requested medication (s) are due for refill today: yes  Requested medication (s) are on the active medication list: yes  Last refill:  benazepril : 09/12/22 #90 2 RF           montelukast : 06/10/23 #30  Future visit scheduled: no  Notes to clinic:   The 22nd would be the 14 day mark- so unable to schedule LM on VM to call back   Requested Prescriptions  Pending Prescriptions Disp Refills   benazepril  (LOTENSIN ) 10 MG tablet [Pharmacy Med Name: benazepriL  10 mg tablet (LOTENSIN )] 90 tablet 2    Sig: Take 1 tablet by mouth daily     Cardiovascular:  ACE Inhibitors Failed - 07/02/2023  2:36 PM      Failed - Cr in normal range and within 180 days    Creat  Date Value Ref Range Status  04/21/2022 0.67 0.50 - 1.03 mg/dL Final         Failed - K in normal range and within 180 days    Potassium  Date Value Ref Range Status  04/21/2022 4.3 3.5 - 5.3 mmol/L Final         Failed - Valid encounter within last 6 months    Recent Outpatient Visits   None            Passed - Patient is not pregnant      Passed - Last BP in normal range    BP Readings from Last 1 Encounters:  08/04/22 129/80          montelukast  (SINGULAIR ) 10 MG tablet [Pharmacy Med Name: montelukast  10 mg tablet (SINGULAIR )] 30 tablet 0    Sig: TAKE 1 TABLET BY MOUTH AT BEDTIME.     Pulmonology:  Leukotriene Inhibitors Failed - 07/02/2023  2:36 PM      Failed - Valid encounter within last 12 months    Recent Outpatient Visits   None

## 2023-07-13 ENCOUNTER — Other Ambulatory Visit: Payer: Self-pay | Admitting: Family Medicine

## 2023-07-13 DIAGNOSIS — J302 Other seasonal allergic rhinitis: Secondary | ICD-10-CM

## 2023-07-13 DIAGNOSIS — I10 Essential (primary) hypertension: Secondary | ICD-10-CM

## 2023-07-15 NOTE — Telephone Encounter (Signed)
 Requested medication (s) are due for refill today: yes  Requested medication (s) are on the active medication list: yes  Last refill:  06/13/23  Future visit scheduled: no  Notes to clinic:  Unable to refill per protocol, courtesy refill already given, routing for provider approval.      Requested Prescriptions  Pending Prescriptions Disp Refills   benazepril  (LOTENSIN ) 10 MG tablet [Pharmacy Med Name: benazepriL  10 mg tablet (LOTENSIN )] 90 tablet 2    Sig: Take 1 tablet by mouth daily     Cardiovascular:  ACE Inhibitors Failed - 07/15/2023  9:37 AM      Failed - Cr in normal range and within 180 days    Creat  Date Value Ref Range Status  04/21/2022 0.67 0.50 - 1.03 mg/dL Final         Failed - K in normal range and within 180 days    Potassium  Date Value Ref Range Status  04/21/2022 4.3 3.5 - 5.3 mmol/L Final         Failed - Valid encounter within last 6 months    Recent Outpatient Visits   None            Passed - Patient is not pregnant      Passed - Last BP in normal range    BP Readings from Last 1 Encounters:  08/04/22 129/80          montelukast  (SINGULAIR ) 10 MG tablet [Pharmacy Med Name: montelukast  10 mg tablet (SINGULAIR )] 30 tablet 0    Sig: TAKE 1 TABLET BY MOUTH AT BEDTIME.     Pulmonology:  Leukotriene Inhibitors Failed - 07/15/2023  9:37 AM      Failed - Valid encounter within last 12 months    Recent Outpatient Visits   None

## 2023-10-13 ENCOUNTER — Telehealth: Payer: Self-pay

## 2023-10-13 NOTE — Telephone Encounter (Signed)
 Copied from CRM #[id number]. Topic: Clinical - Medication Question >> Oct 13, 2023 11:43 AM Myrick T wrote: Reason for CRM: Sonny from [facility] called stating they have changed from the M[employer] to [employer] brand for the med levothyroxine  (SYNTHROID ) 112 MCG tablet .
